# Patient Record
Sex: Female | Born: 1985 | Race: Black or African American | Hispanic: No | Marital: Single | Smoking: Former smoker
Health system: Southern US, Community
[De-identification: ages and names within clinical notes are randomized; demographics above are authoritative.]

---
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
## 2006-05-30 ENCOUNTER — Emergency Department (HOSPITAL_COMMUNITY): Admission: EM | Admit: 2006-05-30 | Discharge: 2006-05-30 | Payer: Self-pay | Admitting: Emergency Medicine

## 2006-06-04 ENCOUNTER — Emergency Department (HOSPITAL_COMMUNITY): Admission: EM | Admit: 2006-06-04 | Discharge: 2006-06-04 | Payer: Self-pay | Admitting: Family Medicine

## 2007-03-11 ENCOUNTER — Emergency Department (HOSPITAL_COMMUNITY): Admission: EM | Admit: 2007-03-11 | Discharge: 2007-03-11 | Payer: Self-pay | Admitting: Emergency Medicine

## 2008-05-10 ENCOUNTER — Emergency Department (HOSPITAL_COMMUNITY): Admission: EM | Admit: 2008-05-10 | Discharge: 2008-05-10 | Payer: Self-pay | Admitting: Emergency Medicine

## 2008-08-18 ENCOUNTER — Emergency Department (HOSPITAL_COMMUNITY): Admission: EM | Admit: 2008-08-18 | Discharge: 2008-08-18 | Payer: Self-pay | Admitting: Emergency Medicine

## 2008-08-20 ENCOUNTER — Emergency Department (HOSPITAL_COMMUNITY): Admission: EM | Admit: 2008-08-20 | Discharge: 2008-08-20 | Payer: Self-pay | Admitting: Emergency Medicine

## 2009-05-30 ENCOUNTER — Emergency Department (HOSPITAL_COMMUNITY): Admission: EM | Admit: 2009-05-30 | Discharge: 2009-05-30 | Payer: Self-pay | Admitting: Emergency Medicine

## 2009-12-28 ENCOUNTER — Emergency Department (HOSPITAL_COMMUNITY): Admission: EM | Admit: 2009-12-28 | Discharge: 2009-12-28 | Payer: Self-pay | Admitting: Emergency Medicine

## 2010-01-09 ENCOUNTER — Emergency Department (HOSPITAL_COMMUNITY): Admission: EM | Admit: 2010-01-09 | Discharge: 2010-01-09 | Payer: Self-pay | Admitting: Family Medicine

## 2010-10-25 LAB — WET PREP, GENITAL

## 2010-10-25 LAB — PREGNANCY, URINE: Preg Test, Ur: NEGATIVE

## 2011-05-04 LAB — URINALYSIS, ROUTINE W REFLEX MICROSCOPIC
Nitrite: NEGATIVE
Specific Gravity, Urine: 1.028
pH: 6

## 2011-05-04 LAB — URINE CULTURE: Colony Count: >=100000

## 2011-05-04 LAB — POCT PREGNANCY, URINE: Operator id: 234501

## 2012-10-19 ENCOUNTER — Encounter (HOSPITAL_COMMUNITY): Payer: Self-pay

## 2012-10-19 ENCOUNTER — Inpatient Hospital Stay (HOSPITAL_COMMUNITY): Payer: Self-pay

## 2012-10-19 ENCOUNTER — Inpatient Hospital Stay (HOSPITAL_COMMUNITY)
Admission: AD | Admit: 2012-10-19 | Discharge: 2012-10-19 | Disposition: A | Discharge status: Home / Self Care | Payer: Self-pay | Source: Ambulatory Visit | Attending: Obstetrics & Gynecology | Admitting: Obstetrics & Gynecology

## 2012-10-19 DIAGNOSIS — A499 Bacterial infection, unspecified: Secondary | ICD-10-CM | POA: Insufficient documentation

## 2012-10-19 DIAGNOSIS — B9689 Other specified bacterial agents as the cause of diseases classified elsewhere: Secondary | ICD-10-CM | POA: Insufficient documentation

## 2012-10-19 DIAGNOSIS — O4690 Antepartum hemorrhage, unspecified, unspecified trimester: Secondary | ICD-10-CM

## 2012-10-19 DIAGNOSIS — N760 Acute vaginitis: Secondary | ICD-10-CM | POA: Insufficient documentation

## 2012-10-19 DIAGNOSIS — O209 Hemorrhage in early pregnancy, unspecified: Secondary | ICD-10-CM | POA: Insufficient documentation

## 2012-10-19 DIAGNOSIS — O2390 Unspecified genitourinary tract infection in pregnancy, unspecified trimester: Secondary | ICD-10-CM | POA: Insufficient documentation

## 2012-10-19 LAB — WET PREP, GENITAL
Trich, Wet Prep: NONE SEEN
Yeast Wet Prep HPF POC: NONE SEEN

## 2012-10-19 LAB — URINALYSIS, ROUTINE W REFLEX MICROSCOPIC
Bilirubin Urine: NEGATIVE
Glucose, UA: NEGATIVE mg/dL
Ketones, ur: 15 mg/dL — AB
Leukocytes, UA: NEGATIVE
Protein, ur: NEGATIVE mg/dL
Specific Gravity, Urine: >1.03 — ABNORMAL HIGH (ref 1.005–1.030)
pH: 6 (ref 5.0–8.0)

## 2012-10-19 LAB — CBC
HCT: 32.3 % — ABNORMAL LOW (ref 36.0–46.0)
Hemoglobin: 10.5 g/dL — ABNORMAL LOW (ref 12.0–15.0)
MCH: 25.5 pg — ABNORMAL LOW (ref 26.0–34.0)
MCHC: 32.5 g/dL (ref 30.0–36.0)
RBC: 4.12 MIL/uL (ref 3.87–5.11)
WBC: 8.2 10*3/uL (ref 4.0–10.5)

## 2012-10-19 LAB — HCG, QUANTITATIVE, PREGNANCY: hCG, Beta Chain, Quant, S: 476 m[IU]/mL — ABNORMAL HIGH (ref <5)

## 2012-10-19 LAB — URINE MICROSCOPIC-ADD ON

## 2012-10-19 LAB — POCT PREGNANCY, URINE: Preg Test, Ur: POSITIVE — AB

## 2012-10-19 MED ORDER — METRONIDAZOLE 500 MG PO TABS: 500.0000 mg | ORAL_TABLET | Freq: Two times a day (BID) | ORAL | Status: DC

## 2012-10-19 MED ORDER — PRENATAL PLUS 27-1 MG PO TABS: 1.0000 | ORAL_TABLET | Freq: Every day | ORAL | Status: DC

## 2012-10-19 NOTE — MAU Provider Note (Signed)
Chief Complaint: Possible Pregnancy and Vaginal Bleeding   First Provider Initiated Contact with Patient 10/19/12 0602     SUBJECTIVE HPI: Kaitlyn King is a 27 y.o. G2P0010 at [redacted]w[redacted]d by unsure LMP who presents with 3d hx of red vaginal spotting. No antecedent intercourse or irritative vaginal discharge. No cramps or abdominal pain. Home UPT positive. Came tonight after working third shift.   History reviewed. No pertinent past medical history. OB History   Grav Para Term Preterm Abortions TAB SAB Ect Mult Living   2    1 1          # Outc Date GA Lbr Len/2nd Wgt Sex Del Anes PTL Lv   1 TAB            2 CUR              No past surgical history on file. History   Social History  . Marital Status: Single    Spouse Name: N/A    Number of Children: N/A  . Years of Education: N/A   Occupational History  . Not on file.   Social History Main Topics  . Smoking status: Current Every Day Smoker -- 0.50 packs/day for 8 years    Types: Cigarettes  . Smokeless tobacco: Not on file  . Alcohol Use: No  . Drug Use: No  . Sexually Active: Yes   Other Topics Concern  . Not on file   Social History Narrative  . No narrative on file   No current facility-administered medications on file prior to encounter.   No current outpatient prescriptions on file prior to encounter.   No Known Allergies  ROS: Pertinent items in HPI  OBJECTIVE Blood pressure 128/84, pulse 102, temperature 98.2 F (36.8 C), temperature source Oral, resp. rate 18, height 5\' 7"  (1.702 m), weight 143 lb 6.4 oz (65.046 kg), last menstrual period 09/14/2012. GENERAL: Well-developed, well-nourished female in no acute distress.  HEENT: Normocephalic HEART: normal rate RESP: normal effort ABDOMEN: Soft, non-tender EXTREMITIES: Nontender, no edema NEURO: Alert and oriented SPECULUM EXAM: NEFG, physiologic discharge, small amount reddish blood noted, cervix clean BIMANUAL: cervix L/C; uterus retroverted, no  adnexal tenderness or masses  LAB RESULTS Results for orders placed during the hospital encounter of 10/19/12 (from the past 24 hour(s))  URINALYSIS, ROUTINE W REFLEX MICROSCOPIC     Status: Abnormal   Collection Time    10/19/12  5:36 AM      Result Value Range   Color, Urine YELLOW  YELLOW   APPearance CLEAR  CLEAR   Specific Gravity, Urine >1.030 (*) 1.005 - 1.030   pH 6.0  5.0 - 8.0   Glucose, UA NEGATIVE  NEGATIVE mg/dL   Hgb urine dipstick TRACE (*) NEGATIVE   Bilirubin Urine NEGATIVE  NEGATIVE   Ketones, ur 15 (*) NEGATIVE mg/dL   Protein, ur NEGATIVE  NEGATIVE mg/dL   Urobilinogen, UA 1.0  0.0 - 1.0 mg/dL   Nitrite NEGATIVE  NEGATIVE   Leukocytes, UA NEGATIVE  NEGATIVE  URINE MICROSCOPIC-ADD ON     Status: None   Collection Time    10/19/12  5:36 AM      Result Value Range   Squamous Epithelial / LPF RARE  RARE   WBC, UA 0-2  <3 WBC/hpf   RBC / HPF 0-2  <3 RBC/hpf   Urine-Other MUCOUS PRESENT    POCT PREGNANCY, URINE     Status: Abnormal   Collection Time    10/19/12  5:45 AM      Result Value Range   Preg Test, Ur POSITIVE (*) NEGATIVE  WET PREP, GENITAL     Status: Abnormal   Collection Time    10/19/12  6:10 AM      Result Value Range   Yeast Wet Prep HPF POC NONE SEEN  NONE SEEN   Trich, Wet Prep NONE SEEN  NONE SEEN   Clue Cells Wet Prep HPF POC MODERATE (*) NONE SEEN   WBC, Wet Prep HPF POC FEW (*) NONE SEEN  CBC     Status: Abnormal   Collection Time    10/19/12  6:20 AM      Result Value Range   WBC 8.2  4.0 - 10.5 K/uL   RBC 4.12  3.87 - 5.11 MIL/uL   Hemoglobin 10.5 (*) 12.0 - 15.0 g/dL   HCT 16.1 (*) 09.6 - 04.5 %   MCV 78.4  78.0 - 100.0 fL   MCH 25.5 (*) 26.0 - 34.0 pg   MCHC 32.5  30.0 - 36.0 g/dL   RDW 40.9 (*) 81.1 - 91.4 %   Platelets 210  150 - 400 K/uL  ABO/RH     Status: None   Collection Time    10/19/12  6:20 AM      Result Value Range   ABO/RH(D) O POS    HCG, QUANTITATIVE, PREGNANCY     Status: Abnormal   Collection Time     10/19/12  6:20 AM      Result Value Range   hCG, Beta Chain, Quant, S 476 (*) <5 mIU/mL    IMAGING  *RADIOLOGY REPORT*  Clinical Data: Positive pregnancy test, vaginal bleeding for 3  days. Quantitative beta HCG 476  OBSTETRIC <14 WK [place] AND TRANSVAGINAL OB US  Technique: Both transabdominal and transvaginal ultrasound  examinations were performed for complete evaluation of the  gestation as well as the maternal uterus, adnexal regions, and  pelvic cul-de-sac. Transvaginal technique was performed to assess  early pregnancy.  Comparison: None.  Intrauterine gestational sac: Not visualized  Yolk sac: Not visualized  Embryo: Not visualized  Cardiac Activity: Not detected  Maternal uterus/adnexae:  Uterus is normal in size. Endometrial stripe measures 8 mm. No  intrauterine gestational sac demonstrated. No subchorionic  hemorrhage. Right ovary measures 3.2 x 2.1 x 2.2 cm and appears  normal. Left ovary measures 3.9 x 2.1 x 2.3 cm. Probable small  echogenic left corpus luteum cyst demonstrated. No free fluid.  IMPRESSION:  No visualized IUP or adnexal abnormality by ultrasound. This may  be secondary to a pregnancy too early to visualize. Difficult to  completely exclude ectopic. No free fluid noted.  Original Report Authenticated By: Judie Petit. Shick, M.D.  MAU COURSE  ASSESSMENT 1. Vaginal bleeding in pregnancy, first trimester   2. BV (bacterial vaginosis)     PLAN Discharge home Ectopic precautions    Medication List    STOP taking these medications       aspirin-acetaminophen-caffeine 250-250-65 MG per tablet  Commonly known as:  EXCEDRIN MIGRAINE     ibuprofen 200 MG tablet  Commonly known as:  ADVIL,MOTRIN      TAKE these medications       metroNIDAZOLE 500 MG tablet  Commonly known as:  FLAGYL  Take 1 tablet (500 mg total) by mouth every 12 (twelve) hours.     prenatal vitamin w/FE, FA 27-1 MG Tabs  Take 1 tablet by mouth daily.  Follow-up Information    Follow up with THE [facility] [place] MATERNITY ADMISSIONS In 2 days. (, For repeat blood test in 2 days)    Contact information:   [address] [phone] [place] [address] [phone]     Danae Orleans, CNM 10/19/2012 8:08 AM   .  Danae Orleans, CNM 10/19/2012  6:05 AM

## 2012-10-19 NOTE — MAU Note (Signed)
Pt reports light bleeding/spotting since Thursday and + UPT @ home.

## 2012-10-19 NOTE — MAU Note (Signed)
Positive home pregnancy test today. Has had red spotting since Thursday. LMP near the end of February. Denies abdominal pain. States came in to confirm pregnancy.

## 2012-10-21 ENCOUNTER — Inpatient Hospital Stay (HOSPITAL_COMMUNITY)
Admission: AD | Admit: 2012-10-21 | Discharge: 2012-10-21 | Disposition: A | Discharge status: Home / Self Care | Payer: Self-pay | Source: Ambulatory Visit | Attending: Obstetrics & Gynecology | Admitting: Obstetrics & Gynecology

## 2012-10-21 DIAGNOSIS — O209 Hemorrhage in early pregnancy, unspecified: Secondary | ICD-10-CM | POA: Insufficient documentation

## 2012-10-21 DIAGNOSIS — Z3201 Encounter for pregnancy test, result positive: Secondary | ICD-10-CM

## 2012-10-21 LAB — HCG, QUANTITATIVE, PREGNANCY: hCG, Beta Chain, Quant, S: 1044 m[IU]/mL — ABNORMAL HIGH (ref <5)

## 2012-10-21 NOTE — MAU Note (Signed)
States she is still having the same amount of bleeding but is darker red in color. No pain.

## 2012-10-21 NOTE — MAU Provider Note (Signed)
Ms. Kaitlyn King is a 27 y.o. G2P0010 at [redacted]w[redacted]d who presents to MAU today for follow-up quant hCG. The patient continues to have some bleeding similar to when she was here on Sunday. She denies abdominal pain or fever. She has had occasional nausea without vomiting.   BP 130/89  Pulse 69  Temp(Src) 98.1 F (36.7 C) (Oral)  Resp 18  LMP 09/14/2012 GENERAL: Well-developed, well-nourished female in no acute distress.  HEENT: Normocephalic, atraumatic.  LUNGS: Normal effort HEART: Regular rate  SKIN: Warm, dry and without erythema PSYCH: Appropriate mood and affect  Results for orders placed during the hospital encounter of 10/21/12 (from the past 24 hour(s))  HCG, QUANTITATIVE, PREGNANCY     Status: Abnormal   Collection Time    10/21/12  2:55 PM      Result Value Range   hCG, Beta Chain, Quant, S 1044 (*) <5 mIU/mL    A: Appropriate rise in quant hCG after 48 hours  P: Discharge home [place] scheduled for 10/27/12 for viability confirmation Bleeding/Ectopic precautions discussed Patient may return to MAU as needed or if her condition were to change or worsen  Freddi Starr, PA-C 10/21/2012 4:05 PM

## 2012-10-22 NOTE — MAU Provider Note (Signed)
Attestation of Attending Supervision of Advanced Practitioner (PA/CNM/NP): Evaluation and management procedures were performed by the Advanced Practitioner under my supervision and collaboration.  I have reviewed the Advanced Practitioner's note and chart, and I agree with the management and plan.  Aleyza Salmi, MD, FACOG Attending Obstetrician & Gynecologist Faculty Practice, [facility]  

## 2012-10-25 ENCOUNTER — Encounter (HOSPITAL_COMMUNITY): Payer: Self-pay | Admitting: *Deleted

## 2012-10-25 ENCOUNTER — Inpatient Hospital Stay (HOSPITAL_COMMUNITY)
Admission: AD | Admit: 2012-10-25 | Discharge: 2012-10-25 | Disposition: A | Discharge status: Home / Self Care | Payer: Self-pay | Source: Ambulatory Visit | Attending: Family Medicine | Admitting: Family Medicine

## 2012-10-25 DIAGNOSIS — A749 Chlamydial infection, unspecified: Secondary | ICD-10-CM

## 2012-10-25 DIAGNOSIS — O4690 Antepartum hemorrhage, unspecified, unspecified trimester: Secondary | ICD-10-CM

## 2012-10-25 DIAGNOSIS — O98319 Other infections with a predominantly sexual mode of transmission complicating pregnancy, unspecified trimester: Secondary | ICD-10-CM | POA: Insufficient documentation

## 2012-10-25 DIAGNOSIS — O209 Hemorrhage in early pregnancy, unspecified: Secondary | ICD-10-CM | POA: Insufficient documentation

## 2012-10-25 DIAGNOSIS — N739 Female pelvic inflammatory disease, unspecified: Secondary | ICD-10-CM | POA: Insufficient documentation

## 2012-10-25 DIAGNOSIS — A5619 Other chlamydial genitourinary infection: Secondary | ICD-10-CM | POA: Insufficient documentation

## 2012-10-25 LAB — URINALYSIS, ROUTINE W REFLEX MICROSCOPIC
Nitrite: NEGATIVE
Specific Gravity, Urine: >1.03 — ABNORMAL HIGH (ref 1.005–1.030)
pH: 5.5 (ref 5.0–8.0)

## 2012-10-25 MED ORDER — AZITHROMYCIN 250 MG PO TABS: ORAL_TABLET | ORAL | Status: DC

## 2012-10-25 NOTE — MAU Provider Note (Signed)
History     CSN: [id number]  Arrival date and time: 10/25/12 6962   First Provider Initiated Contact with Patient 10/25/12 0900      Chief Complaint  Patient presents with  . Vaginal Bleeding   HPI Kaitlyn King is 27 y.o. G2P0010 [redacted]w[redacted]d weeks presenting with increased vaginal bleeding.in early pregnancy.  She has been followed here with same sxs on 3/20 and 4/1 with appropriately rising BHCGs.   Ultrasound did not see IUGS.   Has ultrasound scheduled for 4/7 here. Blood type is O+.  Also has + chlamydia and needs Rx.  GC is negative.  Denies cramping/abdominal pain.     History reviewed. No pertinent past medical history.  History reviewed. No pertinent past surgical history.  History reviewed. No pertinent family history.  History  Substance Use Topics  . Smoking status: Current Every Day Smoker -- 0.50 packs/day for 8 years    Types: Cigarettes  . Smokeless tobacco: Not on file  . Alcohol Use: No    Allergies: No Known Allergies  Prescriptions prior to admission  Medication Sig Dispense Refill  . metroNIDAZOLE (FLAGYL) 500 MG tablet Take 500 mg by mouth every 12 (twelve) hours. X 7 days, not yet completed      . Prenatal Vit-Fe Fumarate-FA (PRENATAL MULTIVITAMIN) TABS Take 1 tablet by mouth daily at 12 noon.      . [DISCONTINUED] metroNIDAZOLE (FLAGYL) 500 MG tablet Take 1 tablet (500 mg total) by mouth every 12 (twelve) hours.  14 tablet  0    Review of Systems  Constitutional: Negative.   Gastrointestinal: Negative for nausea, vomiting and abdominal pain.  Genitourinary:       + for vaginal bleeding  Neurological: Negative for headaches.   Physical Exam   Blood pressure 118/76, pulse 92, temperature 98.2 F (36.8 C), temperature source Oral, resp. rate 18, height 5\' 7"  (1.702 m), weight 143 lb (64.864 kg), last menstrual period 09/14/2012.  Physical Exam  Constitutional: She is oriented to person, place, and time. She appears well-developed and  well-nourished. No distress.  HENT:  Head: Normocephalic.  Neck: Normal range of motion.  Cardiovascular: Normal rate.   Respiratory: Effort normal.  GI: Soft. She exhibits no distension and no mass. There is no tenderness. There is no rebound and no guarding.  Genitourinary: There is no rash, tenderness or lesion on the right labia. There is no rash, tenderness or lesion on the left labia. Uterus is enlarged (slightly). Uterus is not tender. Right adnexum displays no mass, no tenderness and no fullness. Left adnexum displays no mass, no tenderness and no fullness. There is bleeding (small amount of thin red bleeding without clot) around the vagina. No tenderness around the vagina. No vaginal discharge found.  Neurological: She is alert and oriented to person, place, and time.  Skin: Skin is warm and dry.  Psychiatric: She has a normal mood and affect. Her behavior is normal.   BLOOD TYPE O positive (from previous record) Results for orders placed during the hospital encounter of 10/25/12 (from the past 24 hour(s))  URINALYSIS, ROUTINE W REFLEX MICROSCOPIC     Status: Abnormal   Collection Time    10/25/12  8:45 AM      Result Value Range   Color, Urine YELLOW  YELLOW   APPearance HAZY (*) CLEAR   Specific Gravity, Urine >1.030 (*) 1.005 - 1.030   pH 5.5  5.0 - 8.0   Glucose, UA NEGATIVE  NEGATIVE mg/dL   Hgb  urine dipstick LARGE (*) NEGATIVE   Bilirubin Urine SMALL (*) NEGATIVE   Ketones, ur 15 (*) NEGATIVE mg/dL   Protein, ur 30 (*) NEGATIVE mg/dL   Urobilinogen, UA 0.2  0.0 - 1.0 mg/dL   Nitrite NEGATIVE  NEGATIVE   Leukocytes, UA TRACE (*) NEGATIVE  URINE MICROSCOPIC-ADD ON     Status: Abnormal   Collection Time    10/25/12  8:45 AM      Result Value Range   Squamous Epithelial / LPF FEW (*) RARE   WBC, UA 3-6  <3 WBC/hpf   RBC / HPF TOO NUMEROUS TO COUNT  <3 RBC/hpf   Bacteria, UA MANY (*) RARE  HCG, QUANTITATIVE, PREGNANCY     Status: Abnormal   Collection Time     10/25/12  9:24 AM      Result Value Range   hCG, Beta Chain, Quant, S 867 (*) <5 mIU/mL   MAU Course  Procedures  MDM Discussed plan of care with patient.  With a benign exam and recent appropriate rise in BHCG, will repeat BHCG and have patient return for scheduled outpatient U/S on 4/7.  Will call her with lab results.  She also understands is she has increased bleeding or onset of abdominal pain, she is to return to MAU for evaluation.  Rx for Zithromax 1 gram eRx to pharmacy.  After BHCG results resulted, I called the patient to report that the BHCG had fallen.  Discussed at length miscarriage, that bleeding would most likely increase with cramping-like a heavy period at this gestation.  May take Motrin for cramping.  Explained that follow up in 2 weeks for another BHCG was important and that the [facility] would call for appt date and time.  She was given time to ask questions.  She voiced understanding of plan of care   Will cancel ultrasound for Monday.     Assessment and Plan  A:  Vaginal bleeding in early pregnancy       Positive chlamydia results on 10/19/12--untreated  P:  Zithromax 1 gm po X 1 eRx to pharmacy     BHCG pending at the time of discharge-will call patient when resulted     Ectopic and threatened miscarriage instructions given  After BHCG results pended, I called the patient to report that the results had fallen.  Discussed at length miscarriage, that bleeding would most likely increase with cramping-like a heavy period at this gestation.  May take Motrin for cramping.  Explained that follow up in 2 2weeks for another BHCG was important and that the [facility] would call for appt date and time.  She was given time to ask questions.  She voiced understanding of plan of care   Ultrasound cancelled  for Monday  Per Radiology tech-Nicole)  Matt Holmes 10/25/2012, 9:08 AM

## 2012-10-25 NOTE — MAU Note (Signed)
Pt presents with complaints of vaginal bleeding that stated on Thursday with bright red bleeding states now that it has gotten worse throughout the night.

## 2012-10-25 NOTE — MAU Provider Note (Signed)
Chart reviewed and agree with management and plan.  

## 2012-10-26 LAB — URINE CULTURE

## 2012-10-27 ENCOUNTER — Ambulatory Visit (HOSPITAL_COMMUNITY): Payer: Self-pay

## 2012-11-05 ENCOUNTER — Other Ambulatory Visit: Payer: Self-pay

## 2012-11-06 ENCOUNTER — Other Ambulatory Visit: Payer: Self-pay

## 2012-11-06 DIAGNOSIS — O039 Complete or unspecified spontaneous abortion without complication: Secondary | ICD-10-CM

## 2012-11-10 ENCOUNTER — Telehealth: Payer: Self-pay | Admitting: Obstetrics and Gynecology

## 2012-11-10 NOTE — Telephone Encounter (Signed)
Spoke to patient for appointment made on 4/24 @1 :30pm for HCG lab repeat. Patient agrees and satisfied.

## 2012-11-13 ENCOUNTER — Other Ambulatory Visit: Payer: Self-pay

## 2012-11-20 ENCOUNTER — Ambulatory Visit: Payer: Self-pay | Admitting: Obstetrics & Gynecology

## 2013-07-03 DIAGNOSIS — Z8619 Personal history of other infectious and parasitic diseases: Secondary | ICD-10-CM | POA: Insufficient documentation

## 2013-08-24 ENCOUNTER — Encounter (HOSPITAL_COMMUNITY): Payer: Self-pay | Admitting: *Deleted

## 2014-05-24 ENCOUNTER — Encounter (HOSPITAL_COMMUNITY): Payer: Self-pay | Admitting: *Deleted

## 2014-07-31 IMAGING — US US OB TRANSVAGINAL
1 series · 14 of 28 positions shown · non-contrast
Comparison: None.

CLINICAL DATA: Positive pregnancy test, vaginal bleeding for 3
days.  Quantitative beta HCG 476

OBSTETRIC <14 WK US AND TRANSVAGINAL OB US
TECHNIQUE: Both transabdominal and transvaginal ultrasound
examinations were performed for complete evaluation of the
gestation as well as the maternal uterus, adnexal regions, and
pelvic cul-de-sac.  Transvaginal technique was performed to assess
early pregnancy.

[Series 1: us ob comp less 14 wks · 32 acquisitions, 14 frames shown]
[im 2/32]
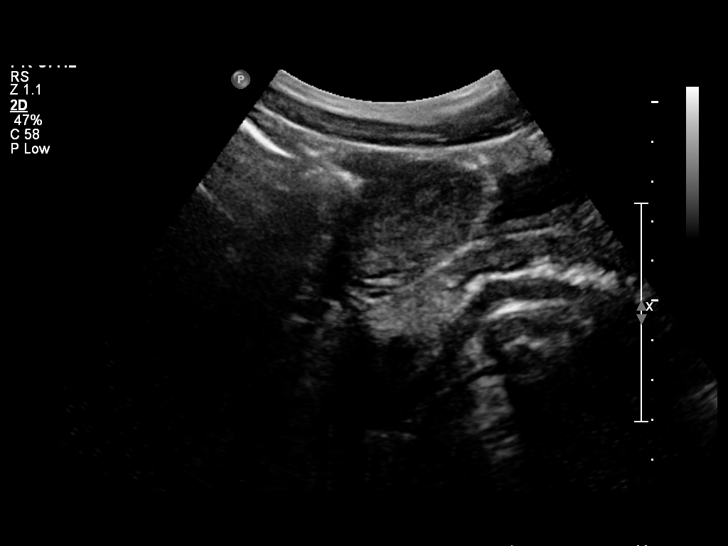
[im 4/32]
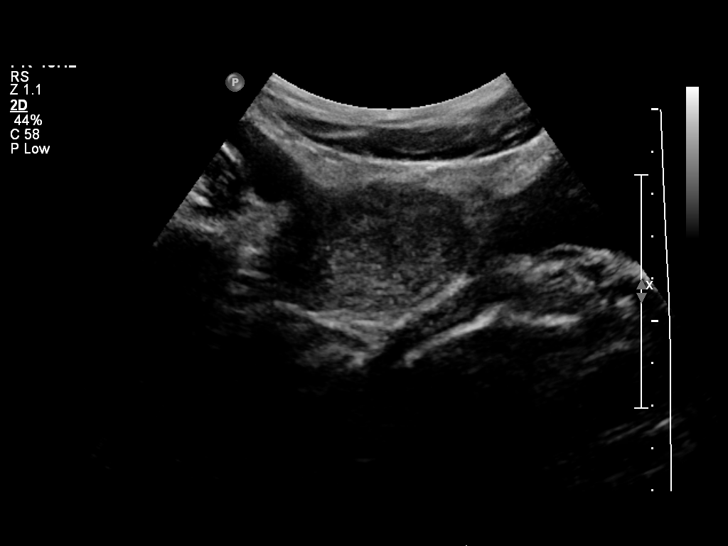
[im 6/32]
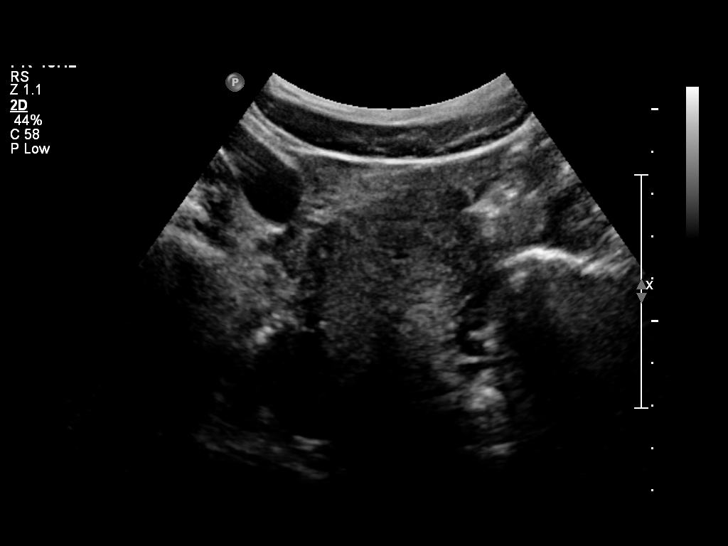
[im 9/32]
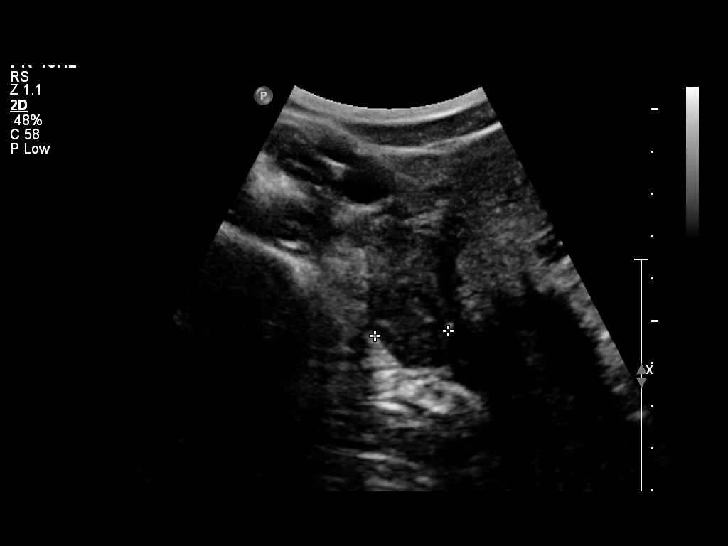
[im 11/32]
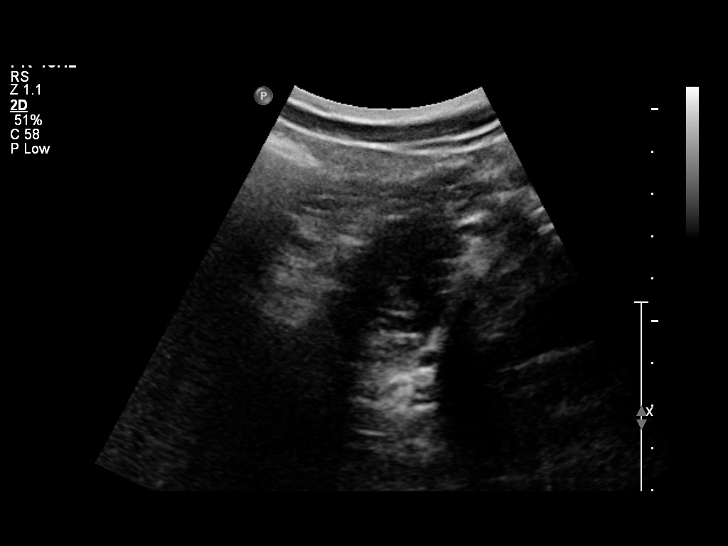
[im 13/32]
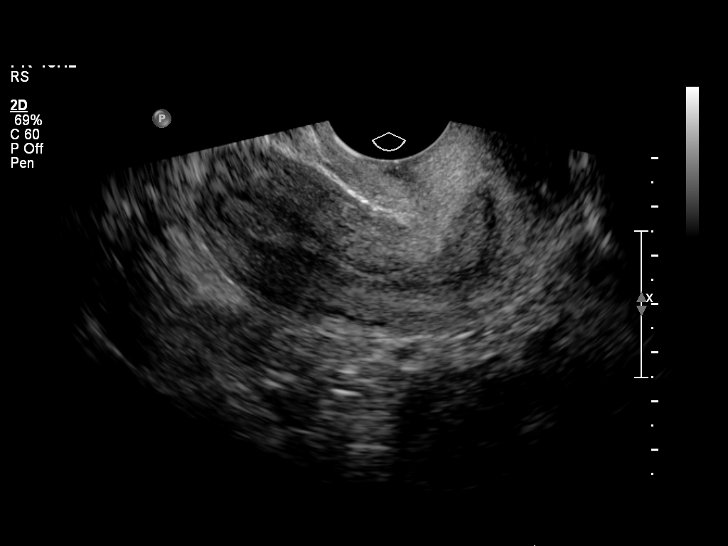
[im 15/32]
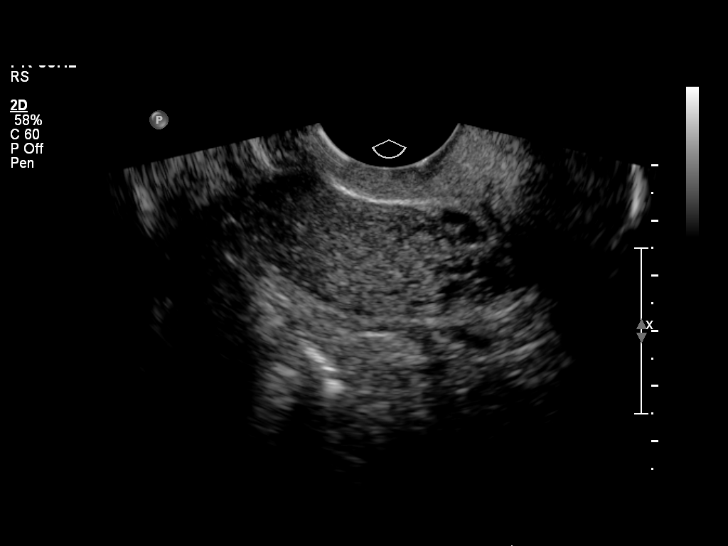
[im 18/32]
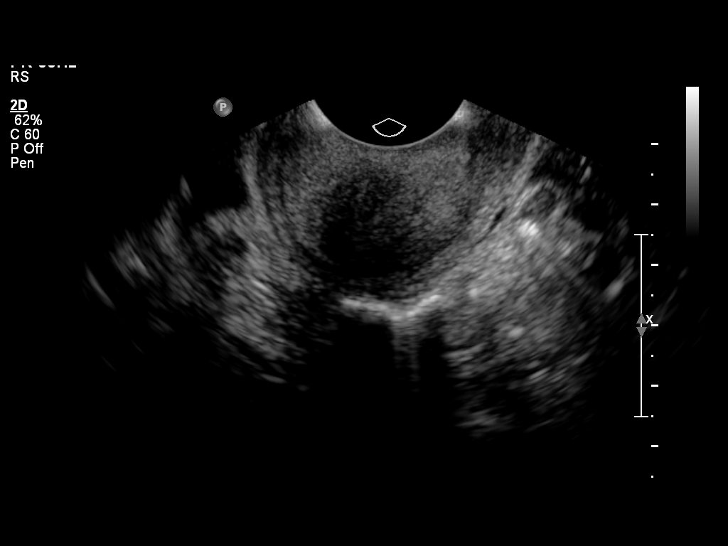
[im 20/32]
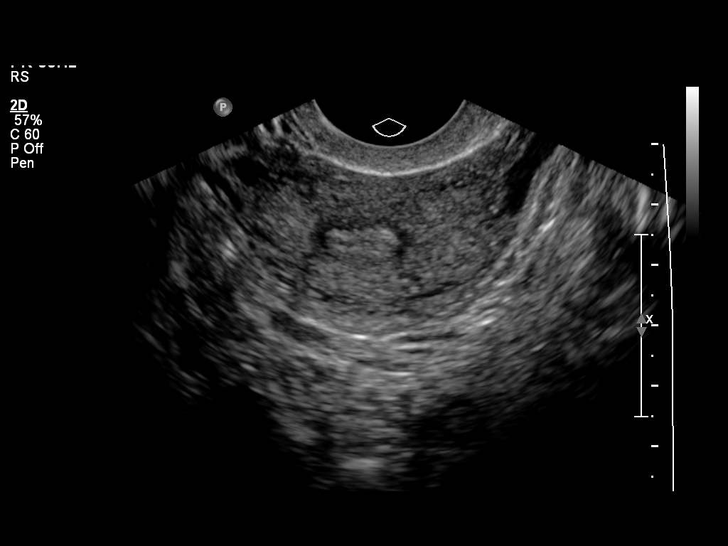
[im 22/32]
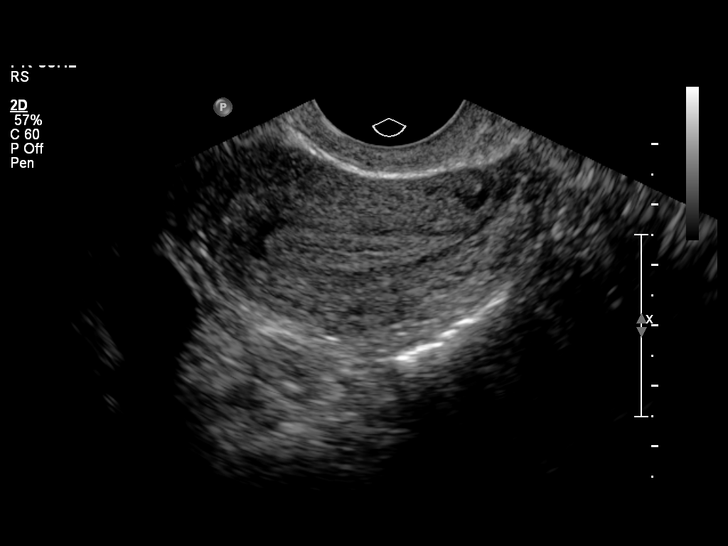
[im 25/32]
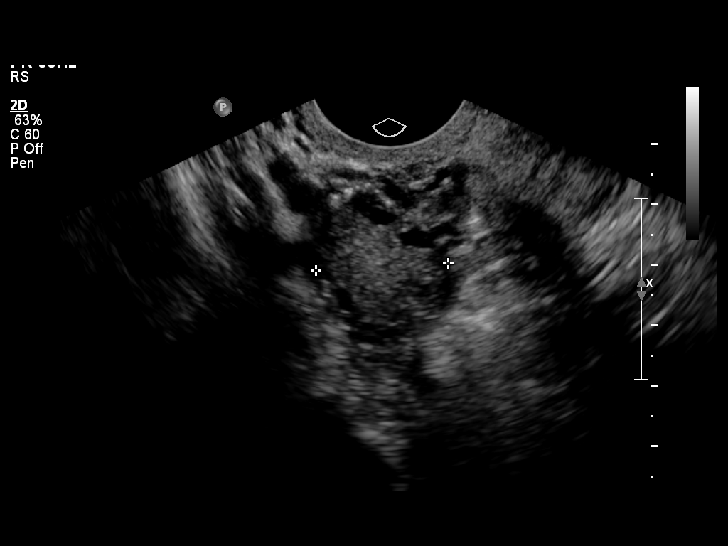
[im 27/32]
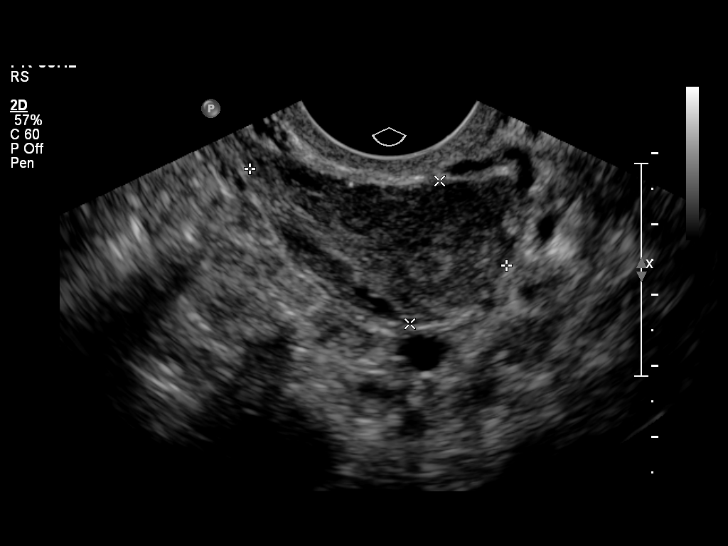
[im 29/32]
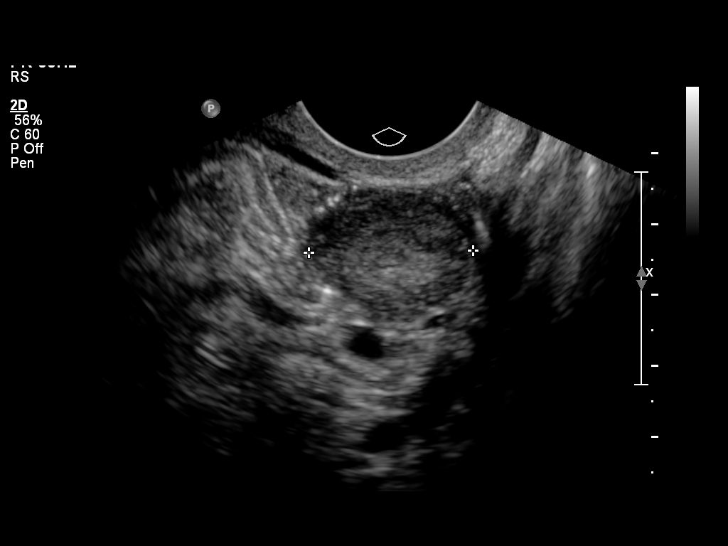
[im 32/32]
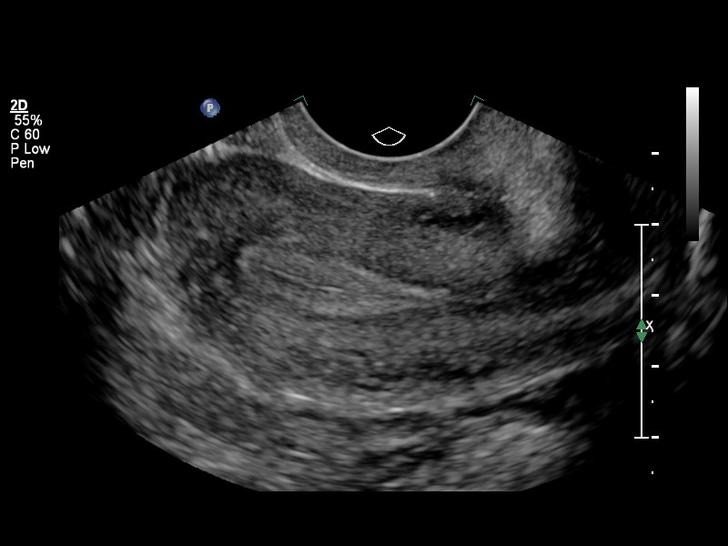

[14 of 28 positions shown; findings below may reference images not displayed]

Intrauterine gestational sac:  Not visualized
Yolk sac: Not visualized
Embryo: Not visualized
Cardiac Activity: Not detected

Maternal uterus/adnexae:
Uterus is normal in size.  Endometrial stripe measures 8 mm.  No
intrauterine gestational sac demonstrated.  No subchorionic
hemorrhage.  Right ovary measures 3.2 x 2.1 x 2.2 cm and appears
normal.  Left ovary measures 3.9 x 2.1 x 2.3 cm.  Probable small
echogenic left corpus luteum cyst demonstrated. No free fluid.
IMPRESSION: No visualized IUP or adnexal abnormality by ultrasound.  This may
be secondary to a pregnancy too early to visualize.  Difficult to
completely exclude ectopic.  No free fluid noted.

## 2015-09-08 DIAGNOSIS — A6004 Herpesviral vulvovaginitis: Secondary | ICD-10-CM | POA: Insufficient documentation

## 2020-11-10 LAB — HM HIV SCREENING LAB: HM HIV Screening: NEGATIVE

## 2020-11-10 LAB — HM PAP SMEAR

## 2020-11-10 LAB — HEPATITIS B SURFACE ANTIGEN: Hepatitis B Surface Ag: NEGATIVE

## 2020-11-10 LAB — HM HEPATITIS C SCREENING LAB: HM Hepatitis Screen: NEGATIVE

## 2022-05-08 ENCOUNTER — Other Ambulatory Visit: Payer: Self-pay

## 2022-05-09 ENCOUNTER — Encounter: Payer: Self-pay | Admitting: Obstetrics

## 2022-05-09 ENCOUNTER — Other Ambulatory Visit (HOSPITAL_COMMUNITY)
Admission: RE | Admit: 2022-05-09 | Discharge: 2022-05-09 | Disposition: A | Discharge status: Home / Self Care | Payer: Medicaid Other | Source: Ambulatory Visit | Attending: Obstetrics | Admitting: Obstetrics

## 2022-05-09 ENCOUNTER — Ambulatory Visit (INDEPENDENT_AMBULATORY_CARE_PROVIDER_SITE_OTHER): Payer: Medicaid Other | Admitting: Obstetrics

## 2022-05-09 VITALS — BP 113/74 | HR 65 | Ht 66.0 in | Wt 204.0 lb

## 2022-05-09 DIAGNOSIS — Z3009 Encounter for other general counseling and advice on contraception: Secondary | ICD-10-CM

## 2022-05-09 DIAGNOSIS — N898 Other specified noninflammatory disorders of vagina: Secondary | ICD-10-CM

## 2022-05-09 DIAGNOSIS — Z113 Encounter for screening for infections with a predominantly sexual mode of transmission: Secondary | ICD-10-CM

## 2022-05-09 DIAGNOSIS — Z01419 Encounter for gynecological examination (general) (routine) without abnormal findings: Secondary | ICD-10-CM | POA: Diagnosis present

## 2022-05-09 DIAGNOSIS — N946 Dysmenorrhea, unspecified: Secondary | ICD-10-CM

## 2022-05-09 DIAGNOSIS — B9689 Other specified bacterial agents as the cause of diseases classified elsewhere: Secondary | ICD-10-CM

## 2022-05-09 DIAGNOSIS — Z30016 Encounter for initial prescription of transdermal patch hormonal contraceptive device: Secondary | ICD-10-CM | POA: Diagnosis not present

## 2022-05-09 DIAGNOSIS — N760 Acute vaginitis: Secondary | ICD-10-CM

## 2022-05-09 DIAGNOSIS — J301 Allergic rhinitis due to pollen: Secondary | ICD-10-CM

## 2022-05-09 LAB — POCT URINE PREGNANCY: Preg Test, Ur: NEGATIVE

## 2022-05-09 MED ORDER — LORATADINE 10 MG PO TABS: 10.0000 mg | ORAL_TABLET | Freq: Every day | ORAL | 11 refills | Status: DC

## 2022-05-09 MED ORDER — IBUPROFEN 800 MG PO TABS: 800.0000 mg | ORAL_TABLET | Freq: Three times a day (TID) | ORAL | 5 refills | Status: DC | PRN

## 2022-05-09 MED ORDER — METRONIDAZOLE 500 MG PO TABS: 500.0000 mg | ORAL_TABLET | Freq: Two times a day (BID) | ORAL | 2 refills | Status: DC

## 2022-05-09 NOTE — Progress Notes (Signed)
Subjective:        Kaitlyn King is a 36 y.o. female here for a routine exam.  Current complaints: Vaginal discharge with odor.    Personal health questionnaire:  Is patient [name], have a family history of breast and/or ovarian cancer: no Is there a family history of uterine cancer diagnosed at age < 74, gastrointestinal cancer, urinary tract cancer, family member who is a Personnel officer syndrome-associated carrier: no Is the patient overweight and hypertensive, family history of diabetes, personal history of gestational diabetes, preeclampsia or PCOS: no Is patient over 28, have PCOS,  family history of premature CHD under age 25, diabetes, smoke, have hypertension or peripheral artery disease:  no At any time, has a partner hit, kicked or otherwise hurt or frightened you?: no Over the past 2 weeks, have you felt down, depressed or hopeless?: no Over the past 2 weeks, have you felt little interest or pleasure in doing things?:no   Gynecologic History Patient's last menstrual period was 04/24/2022 (exact date). Contraception: condoms Last Pap: unknown. Results were: normal Last mammogram: n/a. Results were: n/a  Obstetric History OB History  Gravida Para Term Preterm AB Living  3 1 1   2 1   SAB IAB Ectopic Multiple Live Births  0 1     1    # Outcome Date GA Lbr Len/2nd Weight Sex Delivery Anes PTL Lv  3 Term 03/11/14    F Vag-Spont   LIV  2 AB              Birth Comments: System Generated. Please review and update pregnancy details.  1 IAB             History reviewed. No pertinent past medical history.  History reviewed. No pertinent surgical history.   Current Outpatient Medications:    ibuprofen (ADVIL) 800 MG tablet, Take 1 tablet (800 mg total) by mouth every 8 (eight) hours as needed., Disp: 30 tablet, Rfl: 5   loratadine (CLARITIN) 10 MG tablet, Take 1 tablet (10 mg total) by mouth daily., Disp: 30 tablet, Rfl: 11   metroNIDAZOLE (FLAGYL) 500 MG tablet,  Take 1 tablet (500 mg total) by mouth 2 (two) times daily., Disp: 14 tablet, Rfl: 2   azithromycin (ZITHROMAX) 250 MG tablet, Take all 4 tabs at one time (Patient not taking: Reported on 05/09/2022), Disp: 4 each, Rfl: 0   metroNIDAZOLE (FLAGYL) 500 MG tablet, Take 500 mg by mouth every 12 (twelve) hours. X 7 days, not yet completed (Patient not taking: Reported on 05/09/2022), Disp: , Rfl:    Prenatal Vit-Fe Fumarate-FA (PRENATAL MULTIVITAMIN) TABS, Take 1 tablet by mouth daily at 12 noon. (Patient not taking: Reported on 05/09/2022), Disp: , Rfl:  No Known Allergies  Social History   Tobacco Use   Smoking status: Former    Packs/day: 0.50    Years: 8.00    Total pack years: 4.00    Types: Cigarettes   Smokeless tobacco: Former  Substance Use Topics   Alcohol use: Yes    Family History  Problem Relation Age of Onset   Cancer Maternal Grandmother       Review of Systems  Constitutional: negative for fatigue and weight loss Respiratory: negative for cough and wheezing Cardiovascular: negative for chest pain, fatigue and palpitations Gastrointestinal: negative for abdominal pain and change in bowel habits Musculoskeletal:negative for myalgias Neurological: negative for gait problems and tremors Behavioral/Psych: negative for abusive relationship, depression Endocrine: negative for temperature intolerance  Genitourinary:positive for vaginal discharge with odor.  negative for abnormal menstrual periods, genital lesions, hot flashes, sexual problems  Integument/breast: negative for breast lump, breast tenderness, nipple discharge and skin lesion(s)    Objective:       BP 113/74   Pulse 65   Ht 5\' 6"  (1.676 m)   Wt 204 lb (92.5 kg)   LMP 04/24/2022 (Exact Date)   BMI 32.93 kg/m  General:   Alert and no distress  Skin:   no rash or abnormalities  Lungs:   clear to auscultation bilaterally  Heart:   regular rate and rhythm, S1, S2 normal, no murmur, click, rub or gallop   Breasts:   normal without suspicious masses, skin or nipple changes or axillary nodes  Abdomen:  normal findings: no organomegaly, soft, non-tender and no hernia  Pelvis:  External genitalia: normal general appearance Urinary system: urethral meatus normal and bladder without fullness, nontender Vaginal: normal without tenderness, induration or masses Cervix: normal appearance Adnexa: normal bimanual exam Uterus: anteverted and non-tender, normal size   Lab Review Urine pregnancy test Labs reviewed yes Radiologic studies reviewed no  I have spent a total of 20 minutes of face-to-face and non-face-to-face time, excluding clinical staff time, reviewing notes and preparing to see patient, ordering tests and/or medications, and counseling the patient.   Assessment:    1. Encounter for routine gynecological examination with Papanicolaou smear of cervix Rx: - Cytology - PAP( Kaitlyn King)  2. Dysmenorrhea Rx: - ibuprofen (ADVIL) 800 MG tablet; Take 1 tablet (800 mg total) by mouth every 8 (eight) hours as needed.  Dispense: 30 tablet; Refill: 5  3. Vaginal discharge Rx: - Cervicovaginal ancillary only( Kaitlyn King)  4. BV (bacterial vaginosis) Rx: - metroNIDAZOLE (FLAGYL) 500 MG tablet; Take 1 tablet (500 mg total) by mouth 2 (two) times daily.  Dispense: 14 tablet; Refill: 2  5. Screen for STD (sexually transmitted disease) Rx: - Hepatitis C Antibody - Hepatitis B Surface AntiGEN - RPR - HIV antibody (with reflex)  6. Encounter for counseling regarding contraception - wants   7. Encounter for initial prescription of transdermal patch hormonal contraceptive device Rx: - POCT urine pregnancy - norelgestromin-ethinyl estradiol Kaitlyn King) 150-35 MCG/24HR transdermal patch; Place 1 patch onto the skin once a week.  Dispense: 3 patch; Refill: 12  8. Allergic rhinitis due to pollen, unspecified seasonality Rx: - loratadine (CLARITIN) 10 MG tablet; Take 1 tablet (10 mg total) by  mouth daily.  Dispense: 30 tablet; Refill: 11     Plan:    Education reviewed: calcium supplements, depression evaluation, low fat, low cholesterol diet, safe sex/STD prevention, self breast exams, and weight bearing exercise. Contraception: Ortho-Evra patches weekly. Follow up in: 1 year.   Meds ordered this encounter  Medications   metroNIDAZOLE (FLAGYL) 500 MG tablet    Sig: Take 1 tablet (500 mg total) by mouth 2 (two) times daily.    Dispense:  14 tablet    Refill:  2   loratadine (CLARITIN) 10 MG tablet    Sig: Take 1 tablet (10 mg total) by mouth daily.    Dispense:  30 tablet    Refill:  11   ibuprofen (ADVIL) 800 MG tablet    Sig: Take 1 tablet (800 mg total) by mouth every 8 (eight) hours as needed.    Dispense:  30 tablet    Refill:  5   Orders Placed This Encounter  Procedures   Hepatitis C Antibody   Hepatitis B Surface AntiGEN  RPR   HIV antibody (with reflex)   POCT urine pregnancy     Brock Bad, MD 05/15/2022 1:15 PM

## 2022-05-09 NOTE — Progress Notes (Addendum)
36 y.o GYN presents for AEX/PAP/STD screening. C/o vaginal discharge, odor.  Pt wants to try the Patch for [facility].  UPT today is Negative

## 2022-05-10 LAB — HIV ANTIBODY (ROUTINE TESTING W REFLEX): HIV Screen 4th Generation wRfx: NONREACTIVE

## 2022-05-10 LAB — HEPATITIS C ANTIBODY: Hep C Virus Ab: NONREACTIVE

## 2022-05-10 LAB — CERVICOVAGINAL ANCILLARY ONLY
Bacterial Vaginitis (gardnerella): POSITIVE — AB
Candida Glabrata: NEGATIVE
Candida Vaginitis: NEGATIVE
Chlamydia: NEGATIVE
Comment: NEGATIVE
Comment: NEGATIVE
Comment: NEGATIVE
Comment: NEGATIVE
Comment: NEGATIVE
Comment: NORMAL
Neisseria Gonorrhea: NEGATIVE
Trichomonas: NEGATIVE

## 2022-05-10 LAB — HEPATITIS B SURFACE ANTIGEN: Hepatitis B Surface Ag: NEGATIVE

## 2022-05-10 LAB — RPR: RPR Ser Ql: NONREACTIVE

## 2022-05-11 ENCOUNTER — Other Ambulatory Visit: Payer: Self-pay | Admitting: Obstetrics

## 2022-05-14 LAB — CYTOLOGY - PAP
Comment: NEGATIVE
Diagnosis: UNDETERMINED — AB
High risk HPV: NEGATIVE

## 2022-05-15 MED ORDER — NORELGESTROMIN-ETH ESTRADIOL 150-35 MCG/24HR TD PTWK: 1.0000 | MEDICATED_PATCH | TRANSDERMAL | 12 refills | Status: DC

## 2022-07-10 ENCOUNTER — Encounter: Payer: Self-pay | Admitting: Obstetrics

## 2022-07-10 ENCOUNTER — Telehealth (INDEPENDENT_AMBULATORY_CARE_PROVIDER_SITE_OTHER): Payer: Medicaid Other | Admitting: Obstetrics

## 2022-07-10 DIAGNOSIS — Z3045 Encounter for surveillance of transdermal patch hormonal contraceptive device: Secondary | ICD-10-CM

## 2022-07-10 DIAGNOSIS — Z30011 Encounter for initial prescription of contraceptive pills: Secondary | ICD-10-CM

## 2022-07-10 MED ORDER — ORTHO-NOVUM 1/35 (28) 1-35 MG-MCG PO TABS: 1.0000 | ORAL_TABLET | Freq: Every day | ORAL | 11 refills | Status: DC

## 2022-07-10 NOTE — Progress Notes (Signed)
S/w pt for virtual visit.  Pt reports LMP 07/06/22. She states that she wants to switch from Xulane patch to [employer] pills because the patch does not stay on her skin. Last pap 05/09/22

## 2022-07-10 NOTE — Progress Notes (Signed)
    GYNECOLOGY VIRTUAL VISIT ENCOUNTER NOTE  Provider location: Center for [facility]   Patient location: Home  I connected with Kaitlyn King on 07/10/22 at  3:30 PM EST by MyChart Video Encounter and verified that I am speaking with the correct person using two identifiers.   I discussed the limitations, risks, security and privacy concerns of performing an evaluation and management service virtually and the availability of in person appointments. I also discussed with the patient that there may be a patient responsible charge related to this service. The patient expressed understanding and agreed to proceed.   History:  Kaitlyn King is a 36 y.o. [phone] female being evaluated today for problem with poor adhesion with the [facility] Patch.  Requests discontinuing the patch and starting the pill. She denies any abnormal vaginal discharge, bleeding, pelvic pain or other concerns.       No past medical history on file. No past surgical history on file. The following portions of the patient's history were reviewed and updated as appropriate: allergies, current medications, past family history, past medical history, past social history, past surgical history and problem list.   Health Maintenance:  Normal pap and negative HRHPV on 05/09/22.    Review of Systems:  Pertinent items noted in HPI and remainder of comprehensive ROS otherwise negative.  Physical Exam:   General:  Alert, oriented and cooperative. Patient appears to be in no acute distress.  Mental Status: Normal mood and affect. Normal behavior. Normal judgment and thought content.   Respiratory: Normal respiratory effort, no problems with respiration noted  Rest of physical exam deferred due to type of encounter  Labs and Imaging No results found for this or any previous visit (from the past 336 hour(s)). No results found.     Assessment and Plan:     1. Encounter for surveillance of transdermal  patch hormonal contraceptive device - poor adhesion with patch - would like to switch to the pill  2. Encounter for initial prescription of contraceptive pills Rx: - norethindrone-ethinyl estradiol 1/35 (ORTHO-NOVUM 1/35, 28,) tablet; Take 1 tablet by mouth daily.  Dispense: 28 tablet; Refill: 11       I discussed the assessment and treatment plan with the patient. The patient was provided an opportunity to ask questions and all were answered. The patient agreed with the plan and demonstrated an understanding of the instructions.   The patient was advised to call back or seek an in-person evaluation/go to the ED if the symptoms worsen or if the condition fails to improve as anticipated.  I have spent a total of 10 minutes of  non-face-to-face time, excluding clinical staff time, reviewing notes and preparing to see patient, ordering tests and/or medications, and counseling the patient.    Coral Ceo, MD Center for [facility], [facility], [facility] 07/10/2022

## 2023-04-09 ENCOUNTER — Ambulatory Visit: Payer: Medicaid Other

## 2023-04-09 ENCOUNTER — Other Ambulatory Visit: Payer: Self-pay | Admitting: Registered Nurse

## 2023-04-10 ENCOUNTER — Encounter: Payer: Self-pay | Admitting: Registered Nurse

## 2023-04-10 ENCOUNTER — Telehealth: Payer: Self-pay | Admitting: Registered Nurse

## 2023-04-10 DIAGNOSIS — Z0000 Encounter for general adult medical examination without abnormal findings: Secondary | ICD-10-CM

## 2023-04-10 LAB — CMP12+LP+TP+TSH+6AC+CBC/D/PLT
ALT: 10 IU/L (ref 0–32)
AST: 15 IU/L (ref 0–40)
Albumin: 4.2 g/dL (ref 3.9–4.9)
Alkaline Phosphatase: 61 IU/L (ref 44–121)
BUN/Creatinine Ratio: 12 (ref 9–23)
BUN: 11 mg/dL (ref 6–20)
Basophils Absolute: 0 10*3/uL (ref 0.0–0.2)
Basos: 1 %
Bilirubin Total: 1 mg/dL (ref 0.0–1.2)
Calcium: 9.6 mg/dL (ref 8.7–10.2)
Chloride: 105 mmol/L (ref 96–106)
Chol/HDL Ratio: 2.9 ratio (ref 0.0–4.4)
Cholesterol, Total: 168 mg/dL (ref 100–199)
Creatinine, Ser: 0.94 mg/dL (ref 0.57–1.00)
EOS (ABSOLUTE): 0.1 10*3/uL (ref 0.0–0.4)
Eos: 1 %
Estimated CHD Risk: <0.5 times avg. (ref 0.0–1.0)
Free Thyroxine Index: 2.3 (ref 1.2–4.9)
GGT: 14 IU/L (ref 0–60)
Globulin, Total: 2.9 g/dL (ref 1.5–4.5)
Glucose: 74 mg/dL (ref 70–99)
HDL: 58 mg/dL (ref >39)
Hematocrit: 37.9 % (ref 34.0–46.6)
Hemoglobin: 12.1 g/dL (ref 11.1–15.9)
Immature Grans (Abs): 0 10*3/uL (ref 0.0–0.1)
Immature Granulocytes: 0 %
Iron: 115 ug/dL (ref 27–159)
LDH: 192 IU/L (ref 119–226)
LDL Chol Calc (NIH): 97 mg/dL (ref 0–99)
Lymphocytes Absolute: 2.7 10*3/uL (ref 0.7–3.1)
Lymphs: 33 %
MCH: 30 pg (ref 26.6–33.0)
MCHC: 31.9 g/dL (ref 31.5–35.7)
MCV: 94 fL (ref 79–97)
Monocytes Absolute: 0.7 10*3/uL (ref 0.1–0.9)
Monocytes: 9 %
Neutrophils Absolute: 4.6 10*3/uL (ref 1.4–7.0)
Neutrophils: 56 %
Phosphorus: 4 mg/dL (ref 3.0–4.3)
Platelets: 196 10*3/uL (ref 150–450)
Potassium: 4.8 mmol/L (ref 3.5–5.2)
RBC: 4.04 x10E6/uL (ref 3.77–5.28)
RDW: 12 % (ref 11.7–15.4)
Sodium: 140 mmol/L (ref 134–144)
T3 Uptake Ratio: 24 % (ref 24–39)
T4, Total: 9.7 ug/dL (ref 4.5–12.0)
TSH: 0.967 u[IU]/mL (ref 0.450–4.500)
Total Protein: 7.1 g/dL (ref 6.0–8.5)
Triglycerides: 67 mg/dL (ref 0–149)
Uric Acid: 3.7 mg/dL (ref 2.6–6.2)
VLDL Cholesterol Cal: 13 mg/dL (ref 5–40)
WBC: 8.2 10*3/uL (ref 3.4–10.8)
eGFR: 81 mL/min/{1.73_m2} (ref >59)

## 2023-04-10 LAB — HGB A1C W/O EAG: Hgb A1c MFr Bld: 5.3 % (ref 4.8–5.6)

## 2023-04-10 NOTE — Telephone Encounter (Signed)
New Employee Be Well results normal met requirements for insurance discount if nonsmoker.  RN Reece Packer notified to have patient sign required tobacco attestation form and Be Well program form and discuss clinic/employer policies for [facility] clinic use/communicable disease policy and teledoc benefit/account sign up requirement.  My chart message sent to patient  Kaitlyn King,  Your lab results were normal.  Please see RN Reece Packer if you did not discuss clinic policies/procedures and sign your Be Well paperwork or if you want printed copy of your results from [facility].  Normal blood sugar, electrolytes, cholesterol, kidney/liver/thyroid function, iron, and complete blood count.  No anemia or infection seen on complete blood count.  I recommend activity 150 minutes per week, keeping adding sugars in diet less than 35 grams per day/5 teaspoons and fiber 20 grams per day.    Results copied from [facility] portal has not crossed over to epic at this time.  If you have trouble setting up your teledoc account please notify HR team member.  If you are nonsmoker you have met requirements for insurance discount once you set up your teledoc account.  Please let us know if you have further questions or concerns.  Albina Billet NP-C  Chemistries   Glucose  74 mg/dL 70 - 99  Uric Acid  3.7                           Therapeutic target for gout patients: <6.0 mg/dL 2.6 - 6.2  BUN  11 mg/dL 6 - 20  Creatinine  9.14 mg/dL 7.82 - 9.56  eGFR  81  mL/min/1.73   BUN/Creatinine Ratio  12 9 - 23  Sodium  140 mmol/L 134 - 144  Potassium  4.8 mmol/L 3.5 - 5.2  Chloride  105 mmol/L 96 - 106  Calcium  9.6 mg/dL 8.7 - 21.3  Phosphorus  4.0 mg/dL 3.0 - 4.3  Protein, Total  7.1 g/dL 6.0 - 8.5  Albumin  4.2 g/dL 3.9 - 4.9  Globulin, Total  2.9 g/dL 1.5 - 4.5  Bilirubin, Total  1.0 mg/dL 0.0 - 1.2  Alkaline Phosphatase  61 IU/L 44 - 121  LDH  192 IU/L 119 - 226  AST  (SGOT)  15 IU/L 0 - 40  ALT (SGPT)  10 IU/L 0 - 32  GGT  14 IU/L 0 - 60  Iron  115 ug/dL 27 - 086  .   Lipids   Cholesterol, Total  168 mg/dL 578 - 469  Triglycerides  67 mg/dL 0 - 629  HDL Cholesterol  58  mg/dL   VLDL Cholesterol Cal  13 mg/dL 5 - 40  LDL Chol Calc (NIH)  97 mg/dL 0 - 99  T. Chol/HDL Ratio  2.9 ratio 0.0 - 4.4  Please Note:                                                   T. Chol/HDL Ratio                                                            Men  Women  1/2 Avg.Risk  3.4    3.3                                                  Avg.Risk  5.0    4.4                                               2X Avg.Risk  9.6    7.1                                               3X Avg.Risk 23.4   11.0  Estimated CHD Risk  < 0.5                The CHD Risk is based on the T. Chol/HDL ratio. Other                factors affect CHD Risk such as hypertension, smoking,                diabetes, severe obesity, and family history of                premature CHD. times avg. 0.0 - 1.0  .   Thyroid   TSH  0.967 uIU/mL 0.450 - 4.500  Thyroxine (T4)  9.7 ug/dL 4.5 - 59.5  T3 Uptake  24 % 24 - 39  Free Thyroxine Index  2.3 1.2 - 4.9  .   CBC, Platelet Ct, and Diff   WBC  8.2 x10E3/uL 3.4 - 10.8  RBC  4.04 x10E6/uL 3.77 - 5.28  Hemoglobin  12.1 g/dL 63.8 - 75.6  Hematocrit  37.9 % 34.0 - 46.6  MCV  94 fL 79 - 97  MCH  30.0 pg 26.6 - 33.0  MCHC  31.9 g/dL 43.3 - 29.5  RDW  18.8 % 11.7 - 15.4  Platelets  196 x10E3/uL 150 - 450  Neutrophils  56  %  Not Estab.  Lymphs  33  %  Not Estab.  Monocytes  9  %  Not Estab.  Eos  1  %  Not Estab.  Basos  1  %  Not Estab.  Neutrophils (Absolute)  4.6 x10E3/uL 1.4 - 7.0  Lymphs (Absolute)  2.7 x10E3/uL 0.7 - 3.1  Monocytes(Absolute)  0.7 x10E3/uL 0.1 - 0.9  Eos (Absolute)  0.1 x10E3/uL 0.0 -  0.4  Baso (Absolute)  0.0 x10E3/uL 0.0 - 0.2  Immature Granulocytes  0  %  Not Estab.  Immature Grans (Abs)  0.0 x10E3/uL 0.0 - 0.1 Hemoglobin A1c (#[id number])  Test Results Flag Units Reference Interval  Hemoglobin A1c  5.3 % 4.8 - 5.6  Please Note:                                                         .          Prediabetes: 5.7 - 6.4  Diabetes: >6.4          Glycemic control for adults with diabetes: <7.0

## 2023-04-10 NOTE — Progress Notes (Signed)
EE in clinic this morning for her Be Well assessment.  Vital signs all within normal range.  All forms for the Be Well program were covered with the EE.  Blood drawn and the procedure was tolerated well by the EE.  Reece Packer, RN, BSN, MPH, COHN-S

## 2023-04-21 NOTE — Telephone Encounter (Signed)
Patient saw RN Katrinka Blazing 04/09/23 completed Be Well paperwork and NP signed paperwork 04/11/23 and gave [facility] form to HR dept met requirements for insurance discount

## 2023-05-30 ENCOUNTER — Encounter: Payer: Self-pay | Admitting: Registered Nurse

## 2023-05-30 ENCOUNTER — Ambulatory Visit: Payer: Self-pay | Admitting: Registered Nurse

## 2023-05-30 VITALS — BP 130/85 | HR 81 | Temp 98.2°F

## 2023-05-30 DIAGNOSIS — B349 Viral infection, unspecified: Secondary | ICD-10-CM

## 2023-05-30 NOTE — Progress Notes (Signed)
Subjective:    Patient ID: Kaitlyn King, female    DOB: 12-Jan-1986, 37 y.o.   MRN: [id number]  36y/o established african [place] female patient here for evaluation not feeling well, sweaty/feeling hot while in meeting at work [employer] this am.  Stated dry cough earlier in the week.  Has not home covid tested.  Denied known sick contacts.  Tolerating po intake trying to push fluids now and lunch break scheduled in the next hour.  Breakfast without difficulty.      Review of Systems  Constitutional:  Positive for diaphoresis. Negative for chills and fatigue.  HENT:  Negative for congestion, ear discharge, ear pain, mouth sores, sinus pressure, sinus pain, trouble swallowing and voice change.   Eyes:  Negative for photophobia and visual disturbance.  Respiratory:  Positive for cough. Negative for shortness of breath, wheezing and stridor.   Cardiovascular:  Negative for palpitations.  Gastrointestinal:  Negative for abdominal distention, abdominal pain, diarrhea, nausea and vomiting.  Genitourinary:  Negative for difficulty urinating.  Musculoskeletal:  Negative for back pain, gait problem, neck pain and neck stiffness.  Skin:  Negative for rash.  Neurological:  Negative for dizziness, tremors, seizures, syncope, facial asymmetry, speech difficulty, weakness, light-headedness, numbness and headaches.  Psychiatric/Behavioral:  Negative for agitation, confusion and sleep disturbance.        Objective:   Physical Exam Vitals and nursing note reviewed.  Constitutional:      General: She is awake. She is not in acute distress.    Appearance: Normal appearance. She is well-developed, well-groomed and normal weight. She is not ill-appearing, toxic-appearing or diaphoretic.  HENT:     Head: Normocephalic and atraumatic.     Jaw: There is normal jaw occlusion.     Salivary Glands: Right salivary gland is not diffusely enlarged or tender. Left salivary gland is not diffusely  enlarged or tender.     Right Ear: Hearing, tympanic membrane, ear canal and external ear normal. No decreased hearing noted. No laceration, drainage, swelling or tenderness. No middle ear effusion. There is no impacted cerumen. No foreign body. No mastoid tenderness. No PE tube. No hemotympanum. Tympanic membrane is not injected, scarred, perforated, erythematous, retracted or bulging.     Left Ear: Hearing, ear canal and external ear normal. No decreased hearing noted. No laceration, drainage, swelling or tenderness. A middle ear effusion is present. There is no impacted cerumen. No foreign body. No mastoid tenderness. No PE tube. No hemotympanum. Tympanic membrane is not injected, scarred, perforated, erythematous, retracted or bulging.     Nose: Nose normal. No congestion or rhinorrhea.     Right Turbinates: Enlarged and swollen. Not pale.     Left Turbinates: Enlarged and swollen. Not pale.     Right Sinus: No maxillary sinus tenderness or frontal sinus tenderness.     Left Sinus: No maxillary sinus tenderness or frontal sinus tenderness.     Mouth/Throat:     Lips: Pink. No lesions.     Mouth: Mucous membranes are moist. No oral lesions or angioedema.     Dentition: No gum lesions.     Tongue: No lesions. Tongue does not deviate from midline.     Palate: No mass and lesions.     Pharynx: Oropharynx is clear. Uvula midline. Postnasal drip present. No pharyngeal swelling, oropharyngeal exudate, posterior oropharyngeal erythema or uvula swelling.     Tonsils: No tonsillar exudate.     Comments: Cobblestoning posterior pharynx; bilateral TMs intact air fluid level  clear; no debris noted auditory canals; bilateral allergic shiners; nasal sniffing observed in clinic Eyes:     General: Lids are normal. Vision grossly intact. Gaze aligned appropriately. Allergic shiner present. No scleral icterus.       Right eye: No discharge.        Left eye: No discharge.     Extraocular Movements: Extraocular  movements intact.     Conjunctiva/sclera: Conjunctivae normal.     Pupils: Pupils are equal, round, and reactive to light.  Neck:     Trachea: Trachea and phonation normal. No abnormal tracheal secretions.  Cardiovascular:     Rate and Rhythm: Normal rate and regular rhythm.     Pulses: Normal pulses.          Radial pulses are 2+ on the right side and 2+ on the left side.     Heart sounds: Normal heart sounds, S1 normal and S2 normal. Heart sounds not distant. No murmur heard. Pulmonary:     Effort: Pulmonary effort is normal. No respiratory distress.     Breath sounds: Normal breath sounds and air entry. No stridor, decreased air movement or transmitted upper airway sounds. No decreased breath sounds, wheezing, rhonchi or rales.     Comments: Spoke full sentences without difficulty; no cough observed in exam room Abdominal:     General: Abdomen is flat. There is no distension.     Palpations: Abdomen is soft. There is no mass.     Tenderness: There is no abdominal tenderness. There is no right CVA tenderness, left CVA tenderness, guarding or rebound. Negative signs include Murphy's sign.     Hernia: No hernia is present.     Comments: Dull to percussion x 4 quads; abd snd; hypoactive bowel sounds x 4 quads; sitting to supine and reversed quickly to sitting then standing off exam table without assist  Musculoskeletal:        General: Normal range of motion.     Right hand: Normal strength. Normal capillary refill.     Left hand: Normal strength. Normal capillary refill.     Cervical back: Normal range of motion and neck supple. No swelling, edema, deformity, erythema, signs of trauma, lacerations, rigidity, spasms, torticollis, tenderness or crepitus. No pain with movement or muscular tenderness. Normal range of motion.     Thoracic back: No swelling, edema, deformity, signs of trauma, lacerations, spasms or tenderness. Normal range of motion.     Right lower leg: No edema.     Left lower  leg: No edema.  Lymphadenopathy:     Head:     Right side of head: No submental, submandibular, tonsillar, preauricular, posterior auricular or occipital adenopathy.     Left side of head: No submental, submandibular, tonsillar, preauricular, posterior auricular or occipital adenopathy.     Cervical: No cervical adenopathy.     Right cervical: No superficial, deep or posterior cervical adenopathy.    Left cervical: No superficial, deep or posterior cervical adenopathy.  Skin:    General: Skin is warm and moist.     Capillary Refill: Capillary refill takes less than 2 seconds.     Coloration: Skin is not ashen, cyanotic, jaundiced, mottled, pale or sallow.     Findings: No abrasion, abscess, acne, bruising, burn, ecchymosis, erythema, signs of injury, laceration, lesion, petechiae, rash or wound.     Nails: There is no clubbing.     Comments: Patient wearing flannel thin shirt, leggings, [employer]idas crocs exam table moist after patient exam  skin moist but no visible droplets on skin  Neurological:     General: No focal deficit present.     Mental Status: She is alert and oriented to person, place, and time. Mental status is at baseline.     GCS: GCS eye subscore is 4. GCS verbal subscore is 5. GCS motor subscore is 6.     Cranial Nerves: Cranial nerves 2-12 are intact. No cranial nerve deficit, dysarthria or facial asymmetry.     Motor: Motor function is intact. No weakness, tremor, atrophy, abnormal muscle tone or seizure activity.     Coordination: Coordination is intact. Coordination normal.     Gait: Gait is intact. Gait normal.     Comments: In/out of chair and on/off exam table without difficulty; gait sure and steady in clinic; bilateral hand grasp equal 5/5  Psychiatric:        Attention and Perception: Attention and perception normal.        Mood and Affect: Mood and affect normal.        Speech: Speech normal.        Behavior: Behavior normal. Behavior is cooperative.        Thought  Content: Thought content normal.        Cognition and Memory: Cognition and memory normal.        Judgment: Judgment normal.     Home covid test results negative; patient drinking water from her water bottle without difficulty while in exam room      Assessment & Plan:  A-viral illness   P-given 1 free [place] govt home covid test to complete prior to returning to [employer].  Tylenol 1000mg  po q6h prn pain/fever given 4 UD from clinic stock.  Notify NP if new or worsening symptoms [phone] or [email or web address] or my chart.  Discussed with patient viral illnesses circulating in community covid, viral gastroenteritis, adenovirus, rsv, flu.   Start zyrtec or claritin 10mg  po daily for post nasal drip.  Given 2 UD from clinic stock claritin 10mg .  Discussed flu typically body aches/fever which she does not have not testing for today. OTC nyquil/robitussin/delsym per manufacturer instructions   Discussed need to dry up post nasal drip and will help with preventing cough/sore throat also.  Discussed many viruses circulating in community.  Discussed hydrate with water to keep urine pale yellow clear and voiding every 2-4 hours while awake.  Patient may use normal saline nasal spray 2 sprays each nostril q2h wa as needed. flonase 1 spray each nostril BID OTC.  Sudafed may cause drowsiness/elevated heart rate/insomnia/med head.  Avoid driving until he knows how he responds to medication..  Shower prior to bedtime if exposed to triggers.  If allergic dust/dust mites recommend mattress/pillow covers/encasements; washing linens, vacuuming, sweeping, dusting weekly.  Call or return to clinic as needed if these symptoms worsen or fail to improve as anticipated.   Exitcare handouts viral illness  Discussed if GI upset starts hold all intake x 1 hour go home and rest notify me as per above.  I can send in rx for nausea/vomiting medication.   I have recommended clear fluids and bland diet.  Avoid dairy/spicy, fried  and large portions of meat while having nausea.  If vomiting hold po intake x 1 hour.  Then sips clear fluids like broths, ginger ale, power ade, gatorade, pedialyte may advance to soft/bland if no vomiting x 24 hours and appetite returned otherwise hydration main focus.      Patient verbalized understanding  of instructions, agreed with plan of care and had no further questions at this time.  P2:  Avoidance and hand washing.

## 2023-05-30 NOTE — Progress Notes (Signed)
Pt reports a sudden onset of being shaky and hot. Pt reports that she has had a dry cough. Denies chest pain at this time.

## 2023-05-30 NOTE — Patient Instructions (Signed)
Viral Gastroenteritis, Adult  Viral gastroenteritis is also known as the stomach flu. This condition may affect your stomach, small intestine, and large intestine. It can cause sudden watery diarrhea, fever, and vomiting. This condition is caused by many different viruses. These viruses can be passed from person to person very easily (are contagious). Diarrhea and vomiting can make you feel weak and cause you to become dehydrated. You may not be able to keep fluids down. Dehydration can make you tired and thirsty, cause you to have a dry mouth, and decrease how often you urinate. It is important to replace the fluids that you lose from diarrhea and vomiting. What are the causes? Gastroenteritis is caused by many viruses, including rotavirus and norovirus. Norovirus is the most common cause in adults. You can get sick after being exposed to the viruses from other people. You can also get sick by: Eating food, drinking water, or touching a surface contaminated with one of these viruses. Sharing utensils or other personal items with an infected person. What increases the risk? You are more likely to develop this condition if you: Have a weak body defense system (immune system). Live with one or more children who are younger than 2 years. Live in a nursing home. Travel on cruise ships. What are the signs or symptoms? Symptoms of this condition start suddenly 1-3 days after exposure to a virus. Symptoms may last for a few days or for as long as a week. Common symptoms include watery diarrhea and vomiting. Other symptoms include: Fever. Headache. Fatigue. Pain in the abdomen. Chills. Weakness. Nausea. Muscle aches. Loss of appetite. How is this diagnosed? This condition is diagnosed with a medical history and physical exam. You may also have a stool test to check for viruses or other infections. How is this treated? This condition typically goes away on its own. The focus of treatment is to  prevent dehydration and restore lost fluids (rehydration). This condition may be treated with: An oral rehydration solution (ORS) to replace important salts and minerals (electrolytes) in your body. Take this if told by your health care provider. This is a drink that is sold at pharmacies and retail stores. Medicines to help with your symptoms. Probiotic supplements to reduce symptoms of diarrhea. Fluids given through an IV, if dehydration is severe. Older adults and people with other diseases or a weak immune system are at higher risk for dehydration. Follow these instructions at home: Eating and drinking  Take an ORS as told by your health care provider. Drink clear fluids in small amounts as you are able. Clear fluids include: Water. Ice chips. Diluted fruit juice. Low-calorie sports drinks. Drink enough fluid to keep your urine pale yellow. Eat small amounts of healthy foods every 3-4 hours as you are able. This may include whole grains, fruits, vegetables, lean meats, and yogurt. Avoid fluids that contain a lot of sugar or caffeine, such as energy drinks, sports drinks, and soda. Avoid spicy or fatty foods. Avoid alcohol. General instructions  Wash your hands often, especially after having diarrhea or vomiting. If soap and water are not available, use hand sanitizer. Make sure that all people in your household wash their hands well and often. Take over-the-counter and prescription medicines only as told by your health care provider. Rest at home while you recover. Watch your condition for any changes. Take a warm bath to relieve any burning or pain from frequent diarrhea episodes. Keep all follow-up visits. This is important. Contact a health care  provider if you: Cannot keep fluids down. Have symptoms that get worse. Have new symptoms. Feel light-headed or dizzy. Have muscle cramps. Get help right away if you: Have chest pain. Have trouble breathing or you are breathing  very quickly. Have a fast heartbeat. Feel extremely weak or you faint. Have a severe headache, a stiff neck, or both. Have a rash. Have severe pain, cramping, or bloating in your abdomen. Have skin that feels cold and clammy. Feel confused. Have pain when you urinate. Have signs of dehydration, such as: Dark urine, very little urine, or no urine. Cracked lips. Dry mouth. Sunken eyes. Sleepiness. Weakness. Have signs of bleeding, such as: Seeing blood in your vomit. Having vomit that looks like coffee grounds. Having bloody or black stools or stools that look like tar. These symptoms may be an emergency. Get help right away. Call 911. Do not wait to see if the symptoms will go away. Do not drive yourself to the hospital. Summary Viral gastroenteritis is also known as the stomach flu. It can cause sudden watery diarrhea, fever, and vomiting. This condition can be passed from person to person very easily (is contagious). Take an oral rehydration solution (ORS) if told by your health care provider. This is a drink that is sold at pharmacies and retail stores. Wash your hands often, especially after having diarrhea or vomiting. If soap and water are not available, use hand sanitizer. This information is not intended to replace advice given to you by your health care provider. Make sure you discuss any questions you have with your health care provider. Document Revised: 05/08/2021 Document Reviewed: 05/08/2021 Elsevier Patient Education  2024 [employer] Viral Respiratory Infection A respiratory infection is an illness that affects part of the respiratory system, such as the lungs, nose, or throat. A respiratory infection that is caused by a virus is called a viral respiratory infection. Common types of viral respiratory infections include: A cold. The flu (influenza). A respiratory syncytial virus (RSV) infection. What are the causes? This condition is caused by a virus. The virus  may spread through contact with droplets or direct contact with infected people or their mucus or secretions. The virus may spread from person to person (is contagious). What are the signs or symptoms? Symptoms of this condition include: A stuffy or runny nose. A sore throat or cough. Shortness of breath or difficulty breathing. Yellow or green mucus (sputum). Other symptoms may include: A fever. Sweating or chills. Fatigue. Achy muscles. A headache. How is this diagnosed? This condition may be diagnosed based on: Your symptoms. A physical exam. Testing of secretions from the nose or throat. Chest X-ray. How is this treated? This condition may be treated with medicines, such as: Antiviral medicine. This may shorten the length of time a person has symptoms. Expectorants. These make it easier to cough up mucus. Decongestant nasal sprays. Acetaminophen or NSAIDs, such as ibuprofen, to relieve fever and pain. Antibiotic medicines are not prescribed for viral infections.This is because antibiotics are designed to kill bacteria. They do not kill viruses. Follow these instructions at home: Managing pain and congestion Take over-the-counter and prescription medicines only as told by your health care provider. If you have a sore throat, gargle with a mixture of salt and water 3-4 times a day or as needed. To make salt water, completely dissolve -1 tsp (3-6 g) of salt in 1 cup (237 mL) of warm water. Use nose drops made from salt water to ease congestion and soften  raw skin around your nose. Take 2 tsp (10 mL) of honey at bedtime to lessen coughing at night. Do not give honey to children who are younger than 1 year. Drink enough fluid to keep your urine pale yellow. This helps prevent dehydration and helps loosen up mucus. General instructions  Rest as much as possible. Do not drink alcohol. Do not use any products that contain nicotine or tobacco. These products include cigarettes,  chewing tobacco, and vaping devices, such as e-cigarettes. If you need help quitting, ask your health care provider. Keep all follow-up visits. This is important. How is this prevented?     Get an annual flu shot. You may get the flu shot in late summer, fall, or winter. Ask your health care provider when you should get your flu shot. Avoid spreading your infection to other people. If you are sick: Wash your hands with soap and water often, especially after you cough or sneeze. Wash for at least 20 seconds. If soap and water are not available, use alcohol-based hand sanitizer. Cover your mouth when you cough. Cover your nose and mouth when you sneeze. Do not share cups or eating utensils. Clean commonly used objects often. Clean commonly touched surfaces. Stay home from work or school as told by your health care provider. Avoid contact with people who are sick during cold and flu season. This is generally fall and winter. Contact a health care provider if: Your symptoms last for 10 days or longer. Your symptoms get worse over time. You have severe sinus pain in your face or forehead. The glands in your jaw or neck become very swollen. You have shortness of breath. Get help right away if you: Feel pain or pressure in your chest. Have trouble breathing. Faint or feel like you will faint. Have severe and persistent vomiting. Feel confused or disoriented. These symptoms may represent a serious problem that is an emergency. Do not wait to see if the symptoms will go away. Get medical help right away. Call your local emergency services (911 in the [place] Do not drive yourself to the hospital. Summary A respiratory infection is an illness that affects part of the respiratory system, such as the lungs, nose, or throat. A respiratory infection that is caused by a virus is called a viral respiratory infection. Common types of viral respiratory infections include a cold, influenza, and respiratory  syncytial virus (RSV) infection. Symptoms of this condition include a stuffy or runny nose, cough, fatigue, achy muscles, sore throat, and fevers or chills. Antibiotic medicines are not prescribed for viral infections. This is because antibiotics are designed to kill bacteria. They are not effective against viruses. This information is not intended to replace advice given to you by your health care provider. Make sure you discuss any questions you have with your health care provider. Document Revised: 10/13/2020 Document Reviewed: 10/13/2020 Elsevier Patient Education  2024 [employer].

## 2023-07-22 ENCOUNTER — Encounter: Payer: Self-pay | Admitting: Registered Nurse

## 2023-07-22 ENCOUNTER — Telehealth: Payer: Self-pay | Admitting: Registered Nurse

## 2023-07-22 DIAGNOSIS — A084 Viral intestinal infection, unspecified: Secondary | ICD-10-CM

## 2023-07-22 NOTE — Telephone Encounter (Signed)
Asked RN Burna Mortimer to contact patient.  RN Burna Mortimer notified NP "Spoke with Kaitlyn King on the phone and she stated she was throwing up last night but has not thrown up today yet.  Is actually feeling much better now.  Denies any diarrhea.  Advised she may return to work in the morning as long as she has no more vomiting or diarrhea episodes for the remainder of today.  Also advised that anytime she has vomiting or diarrhea episodes she must be out of work until 24 hours after the last episode without medications to stop the diarrhea or vomiting to prevent spread of these illness to other employees."  HR and supervisor notified if no further symptoms may return onsite tomorrow.  Exitcare handout on vomiting and diarrhea and foods to relieve diarrhea sent to patient my chart.

## 2023-07-24 NOTE — Telephone Encounter (Signed)
 Spoke with patient via telephone stated has not had further symptoms today but RN Apolinar instructed her to stay home until symptoms resolved 48 hours before able to return to work.  HR and supervisor notified excused absence extended 24 hours.  Patient A&Ox3 spoke full sentences without difficulty; No audible congestion/throat clearing during 3 minute call  Patient agreed with plan of care and had no further questions at that time.  HR and supervisor notified excused absence extended 24 hours and re-evaluation 07/24/2023

## 2023-07-24 NOTE — Telephone Encounter (Signed)
 Patient returned call 07/24/2023 stated all symptoms remained resolved today denied n/v/d/f/c.  Cleared to return onsite 07/25/2023 HR and supervisor notified.  Patient A&Ox3 verbalized understanding information and had no further questions at this time.

## 2023-08-08 ENCOUNTER — Ambulatory Visit: Payer: Medicaid Other

## 2023-08-08 NOTE — Progress Notes (Unsigned)
EE in clinic for Covid test due to exposure to co-worker who has tested Covid positive.  Temp 99.1 now but no other sx. Covid test - Negative Given masks to use and advised to protect her family at home for next 5-7 days until she is sure she is not going to become sick.  Reece Packer, RN, BSN, MPH, [facility]

## 2023-08-09 NOTE — Telephone Encounter (Signed)
Patient returned as expected and had no further questions or concerns.

## 2023-10-30 ENCOUNTER — Other Ambulatory Visit: Payer: Self-pay

## 2023-10-30 MED ORDER — VALACYCLOVIR HCL 500 MG PO TABS: 500.0000 mg | ORAL_TABLET | Freq: Two times a day (BID) | ORAL | 4 refills | Status: DC

## 2023-10-30 NOTE — Progress Notes (Signed)
 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

## 2023-11-12 ENCOUNTER — Ambulatory Visit: Admitting: Obstetrics

## 2023-12-05 ENCOUNTER — Other Ambulatory Visit (HOSPITAL_COMMUNITY)
Admission: RE | Admit: 2023-12-05 | Discharge: 2023-12-05 | Disposition: A | Discharge status: Home / Self Care | Source: Ambulatory Visit | Attending: Obstetrics | Admitting: Obstetrics

## 2023-12-05 ENCOUNTER — Ambulatory Visit (INDEPENDENT_AMBULATORY_CARE_PROVIDER_SITE_OTHER): Admitting: Obstetrics

## 2023-12-05 ENCOUNTER — Encounter: Payer: Self-pay | Admitting: Obstetrics

## 2023-12-05 VITALS — BP 119/76 | HR 96 | Ht 66.5 in | Wt 190.7 lb

## 2023-12-05 DIAGNOSIS — Z113 Encounter for screening for infections with a predominantly sexual mode of transmission: Secondary | ICD-10-CM | POA: Diagnosis not present

## 2023-12-05 DIAGNOSIS — Z3009 Encounter for other general counseling and advice on contraception: Secondary | ICD-10-CM

## 2023-12-05 DIAGNOSIS — N946 Dysmenorrhea, unspecified: Secondary | ICD-10-CM

## 2023-12-05 DIAGNOSIS — Z1331 Encounter for screening for depression: Secondary | ICD-10-CM | POA: Diagnosis not present

## 2023-12-05 DIAGNOSIS — Z01419 Encounter for gynecological examination (general) (routine) without abnormal findings: Secondary | ICD-10-CM | POA: Diagnosis present

## 2023-12-05 DIAGNOSIS — Z30013 Encounter for initial prescription of injectable contraceptive: Secondary | ICD-10-CM

## 2023-12-05 DIAGNOSIS — N898 Other specified noninflammatory disorders of vagina: Secondary | ICD-10-CM | POA: Insufficient documentation

## 2023-12-05 DIAGNOSIS — J301 Allergic rhinitis due to pollen: Secondary | ICD-10-CM

## 2023-12-05 DIAGNOSIS — A6003 Herpesviral cervicitis: Secondary | ICD-10-CM

## 2023-12-05 LAB — POCT URINE PREGNANCY: Preg Test, Ur: NEGATIVE

## 2023-12-05 MED ORDER — IBUPROFEN 800 MG PO TABS: 800.0000 mg | ORAL_TABLET | Freq: Three times a day (TID) | ORAL | 5 refills | Status: AC | PRN

## 2023-12-05 MED ORDER — VALACYCLOVIR HCL 500 MG PO TABS: 500.0000 mg | ORAL_TABLET | Freq: Two times a day (BID) | ORAL | 5 refills | Status: AC

## 2023-12-05 MED ORDER — LORATADINE 10 MG PO TABS: 10.0000 mg | ORAL_TABLET | Freq: Every day | ORAL | 11 refills | Status: AC

## 2023-12-05 MED ORDER — MEDROXYPROGESTERONE ACETATE 150 MG/ML IM SUSP
150.0000 mg | Freq: Once | INTRAMUSCULAR | Status: AC
  Administered 2023-12-05: 150 mg via INTRAMUSCULAR

## 2023-12-05 MED ORDER — MEDROXYPROGESTERONE ACETATE 150 MG/ML IM SUSP: 150.0000 mg | INTRAMUSCULAR | 4 refills | Status: AC

## 2023-12-05 NOTE — Progress Notes (Unsigned)
 Pt presents for annual exam. Would like to discuss depo. Would like all testing.

## 2023-12-05 NOTE — Progress Notes (Unsigned)
 Subjective:        Kaitlyn King is a 38 y.o. female here for a routine exam.  Current complaints: Vaginal discharge.    Personal health questionnaire:  Is patient [name], have a family history of breast and/or ovarian cancer: no Is there a family history of uterine cancer diagnosed at age < 58, gastrointestinal cancer, urinary tract cancer, family member who is a Personnel officer syndrome-associated carrier: no Is the patient overweight and hypertensive, family history of diabetes, personal history of gestational diabetes, preeclampsia or PCOS: no Is patient over 25, have PCOS,  family history of premature CHD under age 80, diabetes, smoke, have hypertension or peripheral artery disease:  no At any time, has a partner hit, kicked or otherwise hurt or frightened you?: no Over the past 2 weeks, have you felt down, depressed or hopeless?: no Over the past 2 weeks, have you felt little interest or pleasure in doing things?:no   Gynecologic History Patient's last menstrual period was 11/25/2023 (exact date). Contraception: none Last Pap: 2023. Results were: normal Last mammogram: n/a. Results were: n/a  Obstetric History OB History  Gravida Para Term Preterm AB Living  3 1 1  2 1   SAB IAB Ectopic Multiple Live Births  0 1   1    # Outcome Date GA Lbr Len/2nd Weight Sex Type Anes PTL Lv  3 Term 03/11/14    F Vag-Spont   LIV  2 AB              Birth Comments: System Generated. Please review and update pregnancy details.  1 IAB             History reviewed. No pertinent past medical history.  History reviewed. No pertinent surgical history.   Current Outpatient Medications:    medroxyPROGESTERone (DEPO-PROVERA) 150 MG/ML injection, Inject 1 mL (150 mg total) into the muscle every 3 (three) months., Disp: 1 mL, Rfl: 4   ibuprofen  (ADVIL ) 800 MG tablet, Take 1 tablet (800 mg total) by mouth every 8 (eight) hours as needed., Disp: 30 tablet, Rfl: 5   loratadine  (CLARITIN ) 10  MG tablet, Take 1 tablet (10 mg total) by mouth daily., Disp: 30 tablet, Rfl: 11   Prenatal Vit-Fe Fumarate-FA (PRENATAL MULTIVITAMIN) TABS, Take 1 tablet by mouth daily at 12 noon. (Patient not taking: Reported on 05/09/2022), Disp: , Rfl:    valACYclovir  (VALTREX ) 500 MG tablet, Take 1 tablet (500 mg total) by mouth 2 (two) times daily. Take for 3 days and then stop medication. May refill as needed for outbreaks., Disp: 30 tablet, Rfl: 5 No Known Allergies  Social History   Tobacco Use   Smoking status: Former    Current packs/day: 0.50    Average packs/day: 0.5 packs/day for 8.0 years (4.0 ttl pk-yrs)    Types: Cigarettes   Smokeless tobacco: Former  Substance Use Topics   Alcohol use: Yes    Family History  Problem Relation Age of Onset   Cancer Maternal Grandmother       Review of Systems  Constitutional: negative for fatigue and weight loss Respiratory: negative for cough and wheezing Cardiovascular: negative for chest pain, fatigue and palpitations Gastrointestinal: negative for abdominal pain and change in bowel habits Musculoskeletal:negative for myalgias Neurological: negative for gait problems and tremors Behavioral/Psych: negative for abusive relationship, depression Endocrine: negative for temperature intolerance    Genitourinary: positive for vaginal discharge.  negative for abnormal menstrual periods, genital lesions, hot flashes, sexual problems  Integument/breast: negative  for breast lump, breast tenderness, nipple discharge and skin lesion(s)    Objective:       BP 119/76   Pulse 96   Ht 5' 6.5" (1.689 m)   Wt 190 lb 11.2 oz (86.5 kg)   LMP 11/25/2023 (Exact Date)   BMI 30.32 kg/m  General:   Alert and no distress  Skin:   no rash or abnormalities  Lungs:   clear to auscultation bilaterally  Heart:   regular rate and rhythm, S1, S2 normal, no murmur, click, rub or gallop  Breasts:   normal without suspicious masses, skin or nipple changes or axillary  nodes  Abdomen:  normal findings: no organomegaly, soft, non-tender and no hernia  Pelvis:  External genitalia: normal general appearance Urinary system: urethral meatus normal and bladder without fullness, nontender Vaginal: normal without tenderness, induration or masses Cervix: normal appearance Adnexa: normal bimanual exam Uterus: anteverted and non-tender, normal size   Lab Review Urine pregnancy test Labs reviewed yes Radiologic studies reviewed no  I have spent a total of 20 minutes of face-to-face time, excluding clinical staff time, reviewing notes and preparing to see patient, ordering tests and/or medications, and counseling the patient.   Assessment:    1. Encounter for routine gynecological examination with Papanicolaou smear of cervix (Primary) Rx: - Cytology - PAP( [place]) - POCT urine pregnancy  2. Vaginal discharge Rx: - Cervicovaginal ancillary only  3. Screen for STD (sexually transmitted disease) Rx: - Hepatitis C antibody - Hepatitis B surface antigen - HIV Antibody (routine testing w rflx) - RPR  4. Dysmenorrhea Rx: - ibuprofen  (ADVIL ) 800 MG tablet; Take 1 tablet (800 mg total) by mouth every 8 (eight) hours as needed.  Dispense: 30 tablet; Refill: 5  5. Herpes simplex cervicitis Rx: - valACYclovir  (VALTREX ) 500 MG tablet; Take 1 tablet (500 mg total) by mouth 2 (two) times daily. Take for 3 days and then stop medication. May refill as needed for outbreaks.  Dispense: 30 tablet; Refill: 5  6. Encounter for counseling regarding contraception - options discussed - wants Depo  7. Encounter for initial prescription of injectable contraceptive Rx: - medroxyPROGESTERone (DEPO-PROVERA) injection 150 mg - medroxyPROGESTERone (DEPO-PROVERA) 150 MG/ML injection; Inject 1 mL (150 mg total) into the muscle every 3 (three) months.  Dispense: 1 mL; Refill: 4  8. Allergic rhinitis due to pollen, unspecified seasonality Rx: - loratadine  (CLARITIN ) 10  MG tablet; Take 1 tablet (10 mg total) by mouth daily.  Dispense: 30 tablet; Refill: 11     Plan:    Education reviewed: calcium supplements, depression evaluation, low fat, low cholesterol diet, safe sex/STD prevention, self breast exams, and weight bearing exercise. Contraception: Depo-Provera injections. Follow up in: 1 year.   Meds ordered this encounter  Medications   ibuprofen  (ADVIL ) 800 MG tablet    Sig: Take 1 tablet (800 mg total) by mouth every 8 (eight) hours as needed.    Dispense:  30 tablet    Refill:  5   loratadine  (CLARITIN ) 10 MG tablet    Sig: Take 1 tablet (10 mg total) by mouth daily.    Dispense:  30 tablet    Refill:  11   valACYclovir  (VALTREX ) 500 MG tablet    Sig: Take 1 tablet (500 mg total) by mouth 2 (two) times daily. Take for 3 days and then stop medication. May refill as needed for outbreaks.    Dispense:  30 tablet    Refill:  5   medroxyPROGESTERone (DEPO-PROVERA)  injection 150 mg   medroxyPROGESTERone (DEPO-PROVERA) 150 MG/ML injection    Sig: Inject 1 mL (150 mg total) into the muscle every 3 (three) months.    Dispense:  1 mL    Refill:  4   Orders Placed This Encounter  Procedures   Hepatitis C antibody   Hepatitis B surface antigen   HIV Antibody (routine testing w rflx)   RPR   POCT urine pregnancy     Gabrielle Joiner, MD, FACOG Attending Obstetrician & Gynecologist, [facility] for [facility], [facility], [place] 12/05/2023

## 2023-12-06 ENCOUNTER — Ambulatory Visit: Payer: Self-pay | Admitting: Obstetrics

## 2023-12-06 LAB — CERVICOVAGINAL ANCILLARY ONLY
Bacterial Vaginitis (gardnerella): POSITIVE — AB
Candida Glabrata: NEGATIVE
Candida Vaginitis: NEGATIVE
Chlamydia: NEGATIVE
Comment: NEGATIVE
Comment: NEGATIVE
Comment: NEGATIVE
Comment: NEGATIVE
Comment: NEGATIVE
Comment: NORMAL
Neisseria Gonorrhea: NEGATIVE
Trichomonas: NEGATIVE

## 2023-12-06 LAB — HEPATITIS B SURFACE ANTIGEN: Hepatitis B Surface Ag: NEGATIVE

## 2023-12-06 LAB — RPR: RPR Ser Ql: NONREACTIVE

## 2023-12-06 LAB — HEPATITIS C ANTIBODY: Hep C Virus Ab: NONREACTIVE

## 2023-12-06 LAB — HIV ANTIBODY (ROUTINE TESTING W REFLEX): HIV Screen 4th Generation wRfx: NONREACTIVE

## 2023-12-09 LAB — CYTOLOGY - PAP
Comment: NEGATIVE
Diagnosis: UNDETERMINED — AB
High risk HPV: NEGATIVE

## 2023-12-10 ENCOUNTER — Encounter: Payer: Self-pay | Admitting: Registered Nurse

## 2023-12-10 ENCOUNTER — Ambulatory Visit: Payer: Self-pay | Admitting: Registered Nurse

## 2023-12-10 VITALS — BP 122/80 | HR 76 | Temp 98.7°F

## 2023-12-10 DIAGNOSIS — N946 Dysmenorrhea, unspecified: Secondary | ICD-10-CM

## 2023-12-10 MED ORDER — IBUPROFEN 800 MG PO TABS: 800.0000 mg | ORAL_TABLET | Freq: Three times a day (TID) | ORAL | 1 refills | Status: AC | PRN

## 2023-12-10 MED ORDER — METRONIDAZOLE 500 MG PO TABS: 500.0000 mg | ORAL_TABLET | Freq: Two times a day (BID) | ORAL | 0 refills | Status: AC

## 2023-12-10 NOTE — Patient Instructions (Signed)
 Cramps During Your Menstrual Period Cramps during your period is called dysmenorrhea. The cramps cause pain in your lower belly. In some cases, the pain may not be that bad. In other cases, the pain may be so bad that it can affect your daily life for a few days each month. There're two types of this condition: Primary. This happens when a female has their first monthly period or soon after. As a person gets older or has a baby, the cramps usually lessen or go away. Secondary. This type begins later in life. It's caused by a problem in the reproductive system. This type lasts longer. The pain may also be worse than in primary type. What are the causes? Primary dysmenorrhea is caused by hormone changes during your period.  Common causes of secondary dysmenorrhea include: Problems with the tissue that lines the uterus. This tissue may: Grow outside the uterus. Grow into the walls of the uterus. Growths in the uterus that are not cancer (fibroids). Problems with the reproductive organs. These may include problems with the uterus, fallopian tubes, or ovaries. Infection. What increases the risk? You are more likely to get this condition if: You are younger than 37 years old. You had your first period before you were 38 years old. You have irregular or heavy bleeding. You have never given birth. You have family members with the condition. You smoke or use nicotine products. You have high body weight or a low body weight. What are the signs or symptoms? Symptoms of this condition may include: Cramps and pain in the lower belly or lower back. Headaches. Throwing up or feeling like you may throw up. Watery poop (diarrhea) or loose poop. Feeling dizzy. Feeling tired. How is this diagnosed? This condition may be diagnosed based on your symptoms, medical history, and a physical exam. You may also have other tests, including: Imaging tests, such as ultrasound, X-ray, CT scan, or MRI. Blood  tests. A pregnancy test. A pap test. Taking out and testing a small piece of the tissue that lines the uterus. A procedure that uses a device with a camera to look inside the uterus, pelvis, or bladder. How is this treated? Treatment depends on the cause of your condition. You may be given: Medicines for pain. Hormones. These include shots to stop your periods. You may also get hormones through birth control pills or an IUD. Antidepressant medicines. Antibiotics. These are given if your cramps are being caused by an infection. Other treatment may include: Exercise and physical therapy. Meditation, yoga, and acupuncture. Stimulating the nerves that go to the bottom of the spine. If other treatments don't work, your health care provider may suggest having surgery. This is rare. Work with your provider to see which treatment is best for you. Follow these instructions at home: Relieving pain and cramping  Use heat as told. Put the heat on your lower back or belly when you have pain or cramps. Use the heat source that your provider recommends, such as a moist heat pack or a heating pad. Do this as often as told. Place a towel between your skin and the heat source. Leave the heat on for 20-30 minutes. If your skin turns red, take off the heat right away to prevent burns. The risk of burns is higher if you can't feel pain, heat, or cold. Do not sleep with a heating pad on. Exercise as told. Activities such as walking, swimming, or biking can help to relieve pain. Massage your lower back  or belly to help relieve pain. General instructions Take your medicines only as told. Ask your provider if it's safe to drive or use machines while taking your medicine. Avoid alcohol and caffeine during and right before your period. These can make cramps worse. Do not smoke, vape, or use nicotine or tobacco. Keep all follow-up visits. Your provider can change your treatment plan if treatment isn't  working. Contact a health care provider if: You have symptoms that get worse or do not get better with medicine. You have new symptoms. This information is not intended to replace advice given to you by your health care provider. Make sure you discuss any questions you have with your health care provider. Document Revised: 05/21/2023 Document Reviewed: 01/01/2023 Elsevier Patient Education  2024 [employer].

## 2023-12-10 NOTE — Progress Notes (Signed)
 Established Patient Office Visit  Subjective   Patient ID: Kaitlyn King, female    DOB: July 29, 1985  Age: 38 y.o. MRN: [id number]  Chief Complaint  Patient presents with   Medication Refill    [facility] ibuprofen     38y/o african [place] female established patient here to discuss what medication can be filled from [facility] formulary at [facility] clinic from her GYN provider.  Patient was given Rx for valacyclovir , ibuprofen , claritin  and metronidazole .        Review of Systems  Constitutional:  Negative for chills and fever.  Respiratory:  Negative for shortness of breath and wheezing.   Gastrointestinal:  Negative for abdominal pain, nausea and vomiting.      Objective:     BP 122/80 (BP Location: Left Arm, Patient Position: Sitting)   Pulse 76   Temp 98.7 F (37.1 C) (Tympanic)   LMP 11/25/2023 (Exact Date)   SpO2 99%    Physical Exam Vitals and nursing note reviewed.  Constitutional:      General: She is awake. She is not in acute distress.    Appearance: Normal appearance. She is well-developed, well-groomed and overweight. She is not ill-appearing, toxic-appearing or diaphoretic.  HENT:     Head: Normocephalic and atraumatic.     Jaw: There is normal jaw occlusion.     Salivary Glands: Right salivary gland is not diffusely enlarged. Left salivary gland is not diffusely enlarged.     Right Ear: Hearing and external ear normal.     Left Ear: Hearing and external ear normal.     Nose: Nose normal. No congestion or rhinorrhea.     Mouth/Throat:     Lips: Pink. No lesions.     Mouth: Mucous membranes are moist.     Pharynx: Oropharynx is clear.  Eyes:     General: Lids are normal. Vision grossly intact. Gaze aligned appropriately. No scleral icterus.       Right eye: No discharge.        Left eye: No discharge.     Extraocular Movements: Extraocular movements intact.     Conjunctiva/sclera: Conjunctivae normal.     Pupils: Pupils are equal, round, and reactive to  light.  Neck:     Trachea: Trachea normal.  Cardiovascular:     Rate and Rhythm: Normal rate and regular rhythm.  Pulmonary:     Effort: Pulmonary effort is normal.     Breath sounds: Normal breath sounds and air entry. No stridor or transmitted upper airway sounds. No wheezing.     Comments: Spoke full sentences without difficulty; no cough observed in exam room Abdominal:     General: Abdomen is flat.     Palpations: Abdomen is soft.  Musculoskeletal:        General: Normal range of motion.     Cervical back: Normal range of motion and neck supple. No edema, erythema, signs of trauma, rigidity, torticollis or crepitus. No pain with movement. Normal range of motion.  Lymphadenopathy:     Head:     Right side of head: No submandibular or preauricular adenopathy.     Left side of head: No submandibular or preauricular adenopathy.     Cervical:     Right cervical: No superficial cervical adenopathy.    Left cervical: No superficial cervical adenopathy.  Skin:    General: Skin is warm and dry.     Capillary Refill: Capillary refill takes less than 2 seconds.     Coloration: Skin is  not ashen, cyanotic, jaundiced, mottled, pale or sallow.     Findings: No abrasion, bruising, burn, erythema, signs of injury, laceration, petechiae, rash or wound.  Neurological:     General: No focal deficit present.     Mental Status: She is alert and oriented to person, place, and time. Mental status is at baseline.     GCS: GCS eye subscore is 4. GCS verbal subscore is 5. GCS motor subscore is 6.     Cranial Nerves: No cranial nerve deficit, dysarthria or facial asymmetry.     Motor: Motor function is intact. No weakness, tremor, atrophy, abnormal muscle tone or seizure activity.     Coordination: Coordination is intact. Coordination normal.     Gait: Gait is intact. Gait normal.     Comments: In/out of chair without difficulty; gait sure and steady in clinic; bilateral hand grasp equal 5/5   Psychiatric:        Attention and Perception: Attention and perception normal.        Mood and Affect: Mood and affect normal.        Speech: Speech normal.        Behavior: Behavior normal. Behavior is cooperative.        Thought Content: Thought content normal.        Cognition and Memory: Cognition and memory normal.        Judgment: Judgment normal.      No results found for any visits on 12/10/23.    The ASCVD Risk score (Arnett DK, et al., 2019) failed to calculate for the following reasons:   The 2019 ASCVD risk score is only valid for ages 44 to 60     Latest Reference Range & Units 04/09/23 08:15  Sodium 134 - 144 mmol/L 140  Potassium 3.5 - 5.2 mmol/L 4.8  Chloride 96 - 106 mmol/L 105  Glucose 70 - 99 mg/dL 74  BUN 6 - 20 mg/dL 11  Creatinine 1.61 - 0.96 mg/dL 0.45  Calcium 8.7 - 40.9 mg/dL 9.6  BUN/Creatinine Ratio 9 - 23  12  eGFR >59 mL/min/1.73 81  Phosphorus 3.0 - 4.3 mg/dL 4.0  Alkaline Phosphatase 44 - 121 IU/L 61  Albumin 3.9 - 4.9 g/dL 4.2  Uric Acid 2.6 - 6.2 mg/dL 3.7  AST 0 - 40 IU/L 15  ALT 0 - 32 IU/L 10  Total Protein 6.0 - 8.5 g/dL 7.1  Total Bilirubin 0.0 - 1.2 mg/dL 1.0  GGT 0 - 60 IU/L 14  Estimated CHD Risk 0.0 - 1.0 times avg.  < 0.5  LDH 119 - 226 IU/L 192  Total CHOL/HDL Ratio 0.0 - 4.4 ratio 2.9  Cholesterol, Total 100 - 199 mg/dL 811  HDL Cholesterol >91 mg/dL 58  Triglycerides 0 - 478 mg/dL 67  VLDL Cholesterol Cal 5 - 40 mg/dL 13  LDL Chol Calc (NIH) 0 - 99 mg/dL 97  Iron 27 - 295 ug/dL 621  Globulin, Total 1.5 - 4.5 g/dL 2.9  WBC 3.4 - 30.8 M57Q4/ON 8.2  RBC 3.77 - 5.28 x10E6/uL 4.04  Hemoglobin 11.1 - 15.9 g/dL 62.9  HCT 52.8 - 41.3 % 37.9  MCV 79 - 97 fL 94  MCH 26.6 - 33.0 pg 30.0  MCHC 31.5 - 35.7 g/dL 24.4  RDW 01.0 - 27.2 % 12.0  Platelets 150 - 450 x10E3/uL 196  Neutrophils Not Estab. % 56  Immature Granulocytes Not Estab. % 0  NEUT# 1.4 - 7.0 x10E3/uL 4.6  Lymphs Abs 0.7 -  3.1 x10E3/uL 2.7  Monocytes  Absolute 0.1 - 0.9 x10E3/uL 0.7  Basophils Absolute 0.0 - 0.2 x10E3/uL 0.0  Immature Grans (Abs) 0.0 - 0.1 x10E3/uL 0.0  Lymphs Not Estab. % 33  Monocytes Not Estab. % 9  Basos Not Estab. % 1  Eos Not Estab. % 1  EOS (ABSOLUTE) 0.0 - 0.4 x10E3/uL 0.1  Hemoglobin A1C 4.8 - 5.6 % 5.3  TSH 0.450 - 4.500 uIU/mL 0.967  Thyroxine (T4) 4.5 - 12.0 ug/dL 9.7  Free Thyroxine Index 1.2 - 4.9  2.3  T3 Uptake Ratio 24 - 39 % 24   Assessment & Plan:   Problem List Items Addressed This Visit   None Visit Diagnoses       Dysmenorrhea    -  Primary     Dispensed 90 tabs ibuprofen  800mg  po tid prn pain/cramps menstrual from [facility] to patient today  exitcare handout on menstrual.cramps.  Discussed with patient we do not carry on formulary valacyclovir , loratadine , metronidazole  and she will need to have provider [facility] to [facility] up her other Rx.  Take with food and keep follow up as scheduled with GYN provider as no pap/pelvic/STI treatment available at [facility].  Patient verbalized understanding information/instructions and had no further questions at this time.  No follow-ups on file.    Richardine Chancy, NP

## 2023-12-10 NOTE — Progress Notes (Signed)
 Discuss with NP to get all prescriptions here that GYN prescribed.

## 2024-01-02 ENCOUNTER — Other Ambulatory Visit

## 2024-01-06 ENCOUNTER — Other Ambulatory Visit

## 2024-01-06 VITALS — BP 106/81 | HR 93 | Temp 98.6°F | Ht 66.5 in | Wt 192.0 lb

## 2024-01-06 DIAGNOSIS — Z0000 Encounter for general adult medical examination without abnormal findings: Secondary | ICD-10-CM

## 2024-01-06 NOTE — Progress Notes (Signed)
 [facility] Well labs.

## 2024-01-07 ENCOUNTER — Ambulatory Visit: Payer: Self-pay | Admitting: Registered Nurse

## 2024-01-07 DIAGNOSIS — R7402 Elevation of levels of lactic acid dehydrogenase (LDH): Secondary | ICD-10-CM

## 2024-01-07 LAB — CMP12+LP+TP+TSH+6AC+CBC/D/PLT
ALT: 15 IU/L (ref 0–32)
AST: 27 IU/L (ref 0–40)
Albumin: 4.1 g/dL (ref 3.9–4.9)
Alkaline Phosphatase: 57 IU/L (ref 44–121)
BUN/Creatinine Ratio: 18 (ref 9–23)
BUN: 17 mg/dL (ref 6–20)
Bilirubin Total: 0.4 mg/dL (ref 0.0–1.2)
Calcium: 9.2 mg/dL (ref 8.7–10.2)
Chloride: 105 mmol/L (ref 96–106)
Chol/HDL Ratio: 3.1 ratio (ref 0.0–4.4)
Cholesterol, Total: 164 mg/dL (ref 100–199)
Creatinine, Ser: 0.93 mg/dL (ref 0.57–1.00)
Estimated CHD Risk: <0.5 times avg. (ref 0.0–1.0)
Free Thyroxine Index: 1.9 (ref 1.2–4.9)
GGT: 9 IU/L (ref 0–60)
Globulin, Total: 3.3 g/dL (ref 1.5–4.5)
Glucose: 83 mg/dL (ref 70–99)
HDL: 53 mg/dL (ref >39)
Iron: 74 ug/dL (ref 27–159)
LDH: 310 IU/L — ABNORMAL HIGH (ref 119–226)
LDL Chol Calc (NIH): 100 mg/dL — ABNORMAL HIGH (ref 0–99)
Phosphorus: 3.9 mg/dL (ref 3.0–4.3)
Potassium: 4.7 mmol/L (ref 3.5–5.2)
Sodium: 138 mmol/L (ref 134–144)
T3 Uptake Ratio: 28 % (ref 24–39)
T4, Total: 6.9 ug/dL (ref 4.5–12.0)
TSH: 1.43 u[IU]/mL (ref 0.450–4.500)
Total Protein: 7.4 g/dL (ref 6.0–8.5)
Triglycerides: 52 mg/dL (ref 0–149)
Uric Acid: 4 mg/dL (ref 2.6–6.2)
VLDL Cholesterol Cal: 11 mg/dL (ref 5–40)
eGFR: 82 mL/min/{1.73_m2} (ref >59)

## 2024-01-07 LAB — HEMOGLOBIN A1C
Est. average glucose Bld gHb Est-mCnc: 103 mg/dL
Hgb A1c MFr Bld: 5.2 % (ref 4.8–5.6)

## 2024-02-20 ENCOUNTER — Ambulatory Visit

## 2024-02-24 ENCOUNTER — Ambulatory Visit

## 2024-02-24 DIAGNOSIS — Z3042 Encounter for surveillance of injectable contraceptive: Secondary | ICD-10-CM

## 2024-05-21 ENCOUNTER — Telehealth: Payer: Self-pay

## 2024-05-21 MED ORDER — PNV-DHA 27-0.6-0.4-300 MG PO CAPS: 1.0000 | ORAL_CAPSULE | Freq: Every day | ORAL | 11 refills | Status: AC

## 2024-05-21 NOTE — Telephone Encounter (Signed)
 Returned call, pt stating that she was suppose to have PNVs sent from last visit, but nothing at pharmacy, advised rx would be sent
